# Patient Record
Sex: Female | Born: 1980 | Race: White | Hispanic: No | Marital: Single | Smoking: Former smoker
Health system: Southern US, Community
[De-identification: ages and names within clinical notes are randomized; demographics above are authoritative.]

## PROBLEM LIST (undated history)

## (undated) ENCOUNTER — Inpatient Hospital Stay (HOSPITAL_COMMUNITY): Payer: Self-pay

## (undated) DIAGNOSIS — F988 Other specified behavioral and emotional disorders with onset usually occurring in childhood and adolescence: Secondary | ICD-10-CM

## (undated) DIAGNOSIS — N390 Urinary tract infection, site not specified: Secondary | ICD-10-CM

## (undated) HISTORY — DX: Other specified behavioral and emotional disorders with onset usually occurring in childhood and adolescence: F98.8

## (undated) HISTORY — PX: TUBAL LIGATION: SHX77

---

## 2010-02-03 ENCOUNTER — Emergency Department (HOSPITAL_BASED_OUTPATIENT_CLINIC_OR_DEPARTMENT_OTHER): Admission: EM | Admit: 2010-02-03 | Discharge: 2010-02-03 | Payer: Self-pay | Admitting: Emergency Medicine

## 2010-05-27 ENCOUNTER — Emergency Department (HOSPITAL_COMMUNITY): Admission: EM | Admit: 2010-05-27 | Discharge: 2010-05-27 | Payer: Self-pay | Admitting: Family Medicine

## 2010-10-02 ENCOUNTER — Ambulatory Visit: Payer: Self-pay | Admitting: Family Medicine

## 2010-10-02 DIAGNOSIS — IMO0002 Reserved for concepts with insufficient information to code with codable children: Secondary | ICD-10-CM | POA: Insufficient documentation

## 2010-12-02 ENCOUNTER — Ambulatory Visit: Payer: Self-pay | Admitting: Family Medicine

## 2010-12-02 LAB — CONVERTED CEMR LAB
Blood in Urine, dipstick: NEGATIVE
Nitrite: NEGATIVE
Protein, U semiquant: NEGATIVE
Urobilinogen, UA: 0.2
WBC Urine, dipstick: NEGATIVE

## 2011-01-21 NOTE — Assessment & Plan Note (Signed)
Summary: NP/[employer],DF   Vital Signs:  Patient profile:   30 year old female Height:      63 inches Weight:      152.8 pounds BMI:     27.17 Pulse rate:   58 / minute BP sitting:   114 / 70  (left arm) Cuff size:   regular  Vitals Entered By: Garen Grams LPN (October 02, 2010 3:41 PM)  Vision Screen Left Eye w/o Correction: 20/:  20 Right Eye w/o Correction: 20/:  25 Both Eyes w/o Correction: 20/:  20 CC: New Patient, needs forms filled out Is Patient Diabetic? No Pain Assessment Patient in pain? no       Vision Screening:Left eye w/o correction: 20 / 20 Right Eye w/o correction: 20 / 25 Both eyes w/o correction:  20/ 20        Vision Entered By: Garen Grams LPN (October 02, 2010 3:44 PM)   CC:  New Patient and needs forms filled out.  History of Present Illness: pt presents for a work physical.  she denies any acute complaints.    Preventive Screening-Counseling & Management  Alcohol-Tobacco     Smoking Status: quit  Caffeine-Diet-Exercise     Does Patient Exercise: yes      Drug Use:  no.    Past History:  Past Medical History: Preeclampsia with PTD at 34 wks, 2004 IUGR with PTD at 32 wks via emergent C/S for NRFHTs, female, 2009 Term NSVD, female, 2006  Past Surgical History: Caesarean section  Family History: unremarkable  Social History: Married Former Smoker Alcohol use-no Drug use-no Regular exercise-yes Smoking Status:  quit Drug Use:  no Does Patient Exercise:  yes  Physical Exam  General:  Well-developed,well-nourished,in no acute distress; alert,appropriate and cooperative throughout examination Head:  Normocephalic and atraumatic without obvious abnormalities. No apparent alopecia or balding. Eyes:  No corneal or conjunctival inflammation noted. EOMI. Perrla. Funduscopic exam benign, without hemorrhages, exudates or papilledema. Vision grossly normal. Ears:  External ear exam shows no significant lesions or deformities.   Otoscopic examination reveals clear canals, tympanic membranes are intact bilaterally without bulging, retraction, inflammation or discharge. Hearing is grossly normal bilaterally. Nose:  External nasal examination shows no deformity or inflammation. Nasal mucosa are pink and moist without lesions or exudates. Mouth:  Oral mucosa and oropharynx without lesions or exudates.  Teeth in good repair. Neck:  No deformities, masses, or tenderness noted. Lungs:  Normal respiratory effort, chest expands symmetrically. Lungs are clear to auscultation, no crackles or wheezes. Heart:  Normal rate and regular rhythm. S1 and S2 normal without gallop, murmur, click, rub or other extra sounds. Abdomen:  Bowel sounds positive,abdomen soft and non-tender without masses, organomegaly or hernias noted. Msk:  No deformity or scoliosis noted of thoracic or lumbar spine.   Pulses:  R and L carotid,radial,femoral,dorsalis pedis and posterior tibial pulses are full and equal bilaterally Extremities:  No clubbing, cyanosis, edema, or deformity noted with normal full range of motion of all joints.   Neurologic:  No cranial nerve deficits noted. Station and gait are normal. Plantar reflexes are down-going bilaterally. DTRs are symmetrical throughout. Sensory, motor and coordinative functions appear intact.   Impression & Recommendations:  Problem # 1:  HEALTH MAINTENANCE EXAM (ICD-V70.0) Assessment Improved pt doing well.  pt to return to clinic for next well woman exam in April or sooner if needed.  Pt has appointment 10/13/10 for labs.    Patient Instructions: 1)  It was a pleasure to  be your provider today.   2)  F/U in April for next Pap smear and well woman exam.   3)  pt may return to clinic sooner if needed.

## 2011-01-23 NOTE — Assessment & Plan Note (Signed)
Summary: finish school form,df   Vital Signs:  Patient profile:   30 year old female Weight:      152 pounds Temp:     98.4 degrees F oral Pulse rate:   77 / minute Pulse rhythm:   regular BP sitting:   115 / 76  (left arm) Cuff size:   regular  Vitals Entered By: Loralee Pacas CMA (December 02, 2010 5:00 PM)   Other Orders: Urinalysis-[facility] (00000) Hearing- [facility] [phone])   Orders Added: 1)  Urinalysis-[facility] [00000] 2)  Hearing- [facility] [[id number]]    Laboratory Results   Urine Tests  Date/Time Received: December 02, 2010 5:08 PM  Date/Time Reported: December 02, 2010 5:20 PM   Routine Urinalysis   Color: yellow Appearance: Clear Glucose: negative   (Normal Range: Negative) Bilirubin: negative   (Normal Range: Negative) Ketone: negative   (Normal Range: Negative) Spec. Gravity: >=1.030   (Normal Range: 1.003-1.035) Blood: negative   (Normal Range: Negative) pH: 5.5   (Normal Range: 5.0-8.0) Protein: negative   (Normal Range: Negative) Urobilinogen: 0.2   (Normal Range: 0-1) Nitrite: negative   (Normal Range: Negative) Leukocyte Esterace: negative   (Normal Range: Negative)    Comments: .........Marland Kitchenbiochemical negative; microscopic not indicated ...............test performed by......Marland KitchenBonnie A. [place], MLS (ASCP)cm

## 2011-03-13 LAB — URINALYSIS, ROUTINE W REFLEX MICROSCOPIC
Glucose, UA: NEGATIVE mg/dL
Specific Gravity, Urine: 1.031 — ABNORMAL HIGH (ref 1.005–1.030)

## 2011-03-13 LAB — URINE MICROSCOPIC-ADD ON

## 2011-05-13 ENCOUNTER — Emergency Department (HOSPITAL_COMMUNITY)
Admission: EM | Admit: 2011-05-13 | Discharge: 2011-05-13 | Discharge status: Against Medical Advice | Payer: 59 | Attending: Emergency Medicine | Admitting: Emergency Medicine

## 2011-05-13 ENCOUNTER — Emergency Department (HOSPITAL_BASED_OUTPATIENT_CLINIC_OR_DEPARTMENT_OTHER)
Admission: EM | Admit: 2011-05-13 | Discharge: 2011-05-13 | Disposition: A | Discharge status: Home / Self Care | Payer: 59 | Attending: Emergency Medicine | Admitting: Emergency Medicine

## 2011-05-13 DIAGNOSIS — R319 Hematuria, unspecified: Secondary | ICD-10-CM | POA: Insufficient documentation

## 2011-05-13 DIAGNOSIS — R3989 Other symptoms and signs involving the genitourinary system: Secondary | ICD-10-CM | POA: Insufficient documentation

## 2011-05-13 DIAGNOSIS — R350 Frequency of micturition: Secondary | ICD-10-CM | POA: Insufficient documentation

## 2011-05-13 DIAGNOSIS — N390 Urinary tract infection, site not specified: Secondary | ICD-10-CM | POA: Insufficient documentation

## 2011-05-13 LAB — URINALYSIS, ROUTINE W REFLEX MICROSCOPIC
Bilirubin Urine: NEGATIVE
Glucose, UA: NEGATIVE mg/dL
Specific Gravity, Urine: 1.03 (ref 1.005–1.030)
Urobilinogen, UA: 1 mg/dL (ref 0.0–1.0)

## 2011-05-13 LAB — URINE MICROSCOPIC-ADD ON

## 2011-08-21 ENCOUNTER — Encounter: Payer: Self-pay | Admitting: Family Medicine

## 2011-08-21 ENCOUNTER — Ambulatory Visit (INDEPENDENT_AMBULATORY_CARE_PROVIDER_SITE_OTHER): Payer: 59 | Admitting: Family Medicine

## 2011-08-21 VITALS — BP 115/81 | HR 86 | Wt 155.7 lb

## 2011-08-21 DIAGNOSIS — F988 Other specified behavioral and emotional disorders with onset usually occurring in childhood and adolescence: Secondary | ICD-10-CM

## 2011-08-21 NOTE — Progress Notes (Signed)
Subjective:     History was provided by the patient. Shelby Campos is a 30 y.o. female here for evaluation of inattention and distractibility and school related problems.    She has been identified by school personnel as having problems with impulsivity, increased motor activity and classroom disruption.   HPI: Shelby Campos has a several year history of increased motor activity with additional behaviors that include inattention and need for frequent task redirection denies symptoms of impulsivity and is redirectable. 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 Pt was diagnosed with ADD in high school but has been off of meds for 10 years.  She states she did not feel any benefit from the medication and then stopped the meds.  Previously given adderrall for her symptoms.   A review of past neuropsychiatric issues was negative for anxiety disorder, known cognitive impairment, major depression, memory disorder, mood disorder, overt psychiatric disease and speech and language delay.   Similar problems have been observed in other family members.  Inattention criteria reported today include: fails to give close attention to details or makes careless mistakes in school, work, or other activities, has difficulty sustaining attention in tasks or play activities, has difficulty organizing tasks and activities and is easily distracted by extraneous stimuli.  Hyperactivity criteria reported today include: none.  Impulsivity criteria reported today include: blurts out answers before questions have been completed and interrupts or intrudes on others  The following portions of the patient's history were reviewed and updated as appropriate: allergies, current medications, past family history, past medical history, past social history, past surgical history and problem list.  Review of Systems Pertinent items are noted in HPI    Objective:    BP  115/81  Pulse 86  Wt 155 lb 11.2 oz (70.625 kg)  LMP 07/23/2011 Observation of Shelby Campos's behaviors in the exam room included no unusual behaviors.   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  However, without any medication for 10 years.    Plan:    1. Will refer the patient for formal diagnosis and testing for ADD/ADHD.  Will check for TSH.    Duration of today's visit was 15 minutes, with greater than 50% being counseling and care planning.  Follow-up in 2 weeks

## 2011-08-21 NOTE — Patient Instructions (Signed)
It was a pleasure to care for you today. Please make a follow up appointment in 2-3 weeks to discuss ADD and possible medication.  You will be referred for testing to confirm the diagnosis.  Please return after your testing is complete.  Attention Deficit-Hyperactivity Disorder ADHD Attention deficit-hyperactivity disorder (ADHD) is a problem with behavior issues based on the way the brain functions (neurobehavioral disorder). It is a common reason for behavior and academic problems in school. CAUSES The cause of ADHD is unknown in most cases. It may run in families. It sometimes can be associated with learning disabilities and other behavioral problems. SYMPTOMS There are three types of ADHD. Some of the symptoms include:  Inattentive   Gets bored or distracted easily   Loses or forgets things. Forgets to hand in homework.   Has trouble organizing or completing tasks.   Difficulty staying on task.   An inability to organize daily tasks and school work.   Leaving projects, chores and homework unfinished.   Trouble paying attention or responding to details. Careless mistakes.   Difficulty following directions. Often seems like is not listening.   Dislikes activities that require sustained attention (like chores or homework).   Hyperactive-impulsive   Feels like it is impossible to sit still or stay in a seat. Fidgeting with hands and feet.   Trouble waiting turn.   Talking too much or out of turn. Interruptive.   Speaks or acts impulsively   Aggressive, disruptive behavior   Constantly busy or on the go, noisy.   Combined   Has symptoms of both of the above.  Often children with ADHD feel discouraged about themselves and with school. They often perform well below their abilities in school. These symptoms can cause problems in home, school, and in relationships with peers. As children get older, the excess motor activities can calm down, but the problems with paying  attention and staying organized persist. Most children do not outgrow ADHD but with good treatment can learn to cope with the symptoms. DIAGNOSIS When ADHD is suspected, the diagnosis should be made by professionals trained in ADHD.  Diagnosis will include:  Ruling out other reasons for the child's behavior.   The caregivers will check with the child's school and check their medical records.   They will talk to teachers and parents.   Behavior rating scales for the child will be filled out by those dealing with the child on a daily basis.  A diagnosis is made only after all information has been considered. TREATMENT Treatment usually includes behavioral treatment often along with medicines. It may include stimulant medicines. The stimulant medicines decrease impulsivity and hyperactivity and increase attention. Other medicines used include antidepressants and certain blood pressure medicines. Most experts agree that treatment for ADHD should address all aspects of the child's functioning. Treatment should not be limited to the use of medicines alone. Treatment should include structured classroom management. The parents must receive education to address rewarding good behavior, discipline and limit-setting. Tutoring and/or behavioral therapy should be available for the child. If untreated, the disorder can have long term serious effects into adolescence and adulthood. HOMECARE INSTRUCTIONS   Often with ADHD there is a lot of frustration among the family in dealing with the illness. There is often blame and anger that is not warranted. This is a life long illness. There is no way to prevent ADHD. In many cases, because the problem affects the family as a whole, the entire family may need help.  A therapist can help the family find better ways to handle the disruptive behaviors and promote change. If the child is young, most of the therapist's work is with the parents. Parents will learn techniques for  coping with and improving their child's behavior. Sometimes only the child with the ADHD needs counseling. Your caregivers can help you make these decisions.   Children with ADHD may need help in organizing. Here are some helpful tips:   Keep routines the same every day from wake-up time to bedtime. Schedule everything. This includes homework and playtime. This should include outdoor and indoor recreation. Keep the schedule on the refrigerator or a bulletin board where it is frequently seen. Mark schedule changes as far in advance as possible.   Have a place for everything and keep everything in its place. This includes clothing, backpacks, and school supplies.   Encourage writing down assignments and bringing home needed books.   Offer your child a well-balanced diet. Breakfast is especially important for school performance. Children should avoid drinks with caffeine including:   Soft drinks.   Coffee.   Tea.   However, some older children (adolescents) may find these drinks helpful in improving their attention.   Children with ADHD need consistent rules that they can understand and follow. If rules are followed, give small rewards. Children with ADHD often receive, and expect, criticism. Look for good behavior and praise it. Set realistic goals. Give clear instructions. Look for activities that can foster success and self-esteem. Make time for pleasant activities with your child. Give lots of affection.   Parents are their children's greatest advocates. Learn as much as possible about ADHD. This helps you become a stronger and better advocate for your child. It also helps you educate your child's teachers and instructors if they feel inadequate in these areas. Parent support groups are often helpful. A national group with local chapters is called CHADD (Children and Adults with Attention Deficit/Hyperactivity Disorder).  PROGNOSIS  There is no cure for ADHD. Children with the disorder  seldom outgrow it. Many find adaptive ways to accommodate the ADHD as they mature. SEEK MEDICAL CARE IF YOUR CHILD HAS:  Repeated muscle twitches, cough or speech outbursts.   Sleep problems.   Marked loss of appetite.   Depression.   New or worsening behavioral problems.   Dizziness.   Racing heart.   Stomach pains.   Headaches.  Document Released: 11/28/2002 Document Re-Released: 09/16/2008 [facility] Patient Information 2011 [place], [place].

## 2013-03-29 ENCOUNTER — Emergency Department (HOSPITAL_BASED_OUTPATIENT_CLINIC_OR_DEPARTMENT_OTHER)
Admission: EM | Admit: 2013-03-29 | Discharge: 2013-03-29 | Disposition: A | Discharge status: Home / Self Care | Payer: Self-pay | Attending: Emergency Medicine | Admitting: Emergency Medicine

## 2013-03-29 ENCOUNTER — Encounter (HOSPITAL_BASED_OUTPATIENT_CLINIC_OR_DEPARTMENT_OTHER): Payer: Self-pay

## 2013-03-29 ENCOUNTER — Emergency Department (HOSPITAL_BASED_OUTPATIENT_CLINIC_OR_DEPARTMENT_OTHER): Payer: Self-pay

## 2013-03-29 DIAGNOSIS — Z87891 Personal history of nicotine dependence: Secondary | ICD-10-CM | POA: Insufficient documentation

## 2013-03-29 DIAGNOSIS — R109 Unspecified abdominal pain: Secondary | ICD-10-CM | POA: Insufficient documentation

## 2013-03-29 DIAGNOSIS — Z8659 Personal history of other mental and behavioral disorders: Secondary | ICD-10-CM | POA: Insufficient documentation

## 2013-03-29 DIAGNOSIS — O039 Complete or unspecified spontaneous abortion without complication: Secondary | ICD-10-CM | POA: Insufficient documentation

## 2013-03-29 MED ORDER — ACETAMINOPHEN 500 MG PO TABS
1000.0000 mg | ORAL_TABLET | Freq: Once | ORAL | Status: AC
  Administered 2013-03-29: 1000 mg via ORAL

## 2013-03-29 MED ORDER — ACETAMINOPHEN 500 MG PO TABS
ORAL_TABLET | ORAL | Status: AC
  Administered 2013-03-29: 1000 mg via ORAL
  Filled 2013-03-29: qty 2

## 2013-03-29 NOTE — ED Provider Notes (Signed)
History     CSN: [id number]  Arrival date & time 03/29/13  4098   First MD Initiated Contact with Patient 03/29/13 1937      Chief Complaint  Patient presents with  . Vaginal Bleeding    (Consider location/radiation/quality/duration/timing/severity/associated sxs/prior treatment) Patient is a 32 y.o. female presenting with vaginal bleeding. The history is provided by the patient. No language interpreter was used.  Vaginal Bleeding This is a new problem. The current episode started today. The problem occurs constantly. Associated symptoms include abdominal pain. Nothing aggravates the symptoms. She has tried nothing for the symptoms.  Pt is early pregnant.  Last period was Feb 15.   Pt had a positive home pregnancy test.   Pt is a [id number]  Pt complains of cramping.  Pt has not had any prenatal care   Pt is an Char[employer] fundraiser.   Past Medical History  Diagnosis Date  . 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 Cesarean section  2009    low transverse for NRFHTs    Family History  Problem Relation Age of Onset  . ADD / ADHD Mother   . Depression Mother   . ADD / ADHD Brother     History  Substance Use Topics  . Smoking status: Former Games developer  . Smokeless tobacco: Former Neurosurgeon    Quit date: 08/20/2008  . Alcohol Use: Yes    OB History   Grav Para Term Preterm Abortions TAB SAB Ect Mult Living                  Review of Systems  Gastrointestinal: Positive for abdominal pain.  Genitourinary: Positive for vaginal bleeding.  All other systems reviewed and are negative.    Allergies  Review of patient's allergies indicates no known allergies.  Home Medications  No current outpatient prescriptions on file.  BP 131/82  Pulse 77  Temp(Src) 99.8 F (37.7 C) (Oral)  Resp 20  Ht 5\' 2"  (1.575 m)  Wt 165 lb (74.844 kg)  BMI 30.17 kg/m2  SpO2 99%  LMP 02/02/2013  Physical Exam  Vitals  reviewed. Constitutional: She appears well-developed and well-nourished.  HENT:  Head: Normocephalic and atraumatic.  Eyes: Conjunctivae are normal. Pupils are equal, round, and reactive to light.  Neck: Normal range of motion. Neck supple.  Cardiovascular: Normal rate and regular rhythm.   Pulmonary/Chest: Effort normal and breath sounds normal.  Abdominal: Soft.  Genitourinary:  Moderate bleeding,  Os closed,    Musculoskeletal: Normal range of motion.  Neurological: She is alert.  Skin: Skin is warm.  Psychiatric: She has a normal mood and affect.    ED Course  Procedures (including critical care time)  Labs Reviewed  HCG, QUANTITATIVE, PREGNANCY - Abnormal; Notable for the following:    hCG, Beta Chain, Quant, [place] [address] (*)    All other components within normal limits   No results found.   No diagnosis found.    MDM   Results for orders placed during the hospital encounter of 03/29/13  HCG, QUANTITATIVE, PREGNANCY      Result Value Range   hCG, Beta Chain, Quant, [place] [address] (*) <5 mIU/mL   US Ob Comp Less 14 Wks  03/29/2013  *RADIOLOGY REPORT*  Clinical Data: Heavy vaginal bleeding and cramping. First trimester pregnancy.  The quantitative beta HCG is 45,323  OBSTETRIC <14 WK [place] AND TRANSVAGINAL OB US  Technique:  Both transabdominal and  transvaginal ultrasound examinations were performed for complete evaluation of the gestation as well as the maternal uterus, adnexal regions, and pelvic cul-de-sac.  Transvaginal technique was performed to assess early pregnancy.  Comparison:  None.  Intrauterine gestational sac:  None visualized  Maternal uterus/adnexae: Been made of canal is wide in the lower uterine segment hyperechoic material likely representing blood products.  The cervix is partially open. Minimal free fluid is present.  The  IMPRESSION:  1.  No intrauterine pregnancy. 2.  Hyperechoic material within the lower uterine segment is compared with blood products and the  clinical picture of abortion in progress.   Original Report Authenticated By: Marin Roberts, M.D.    US Ob Transvaginal  03/29/2013  *RADIOLOGY REPORT*  Clinical Data: Heavy vaginal bleeding and cramping. First trimester pregnancy.  The quantitative beta HCG is 45,323  OBSTETRIC <14 WK [place] AND TRANSVAGINAL OB US  Technique:  Both transabdominal and transvaginal ultrasound examinations were performed for complete evaluation of the gestation as well as the maternal uterus, adnexal regions, and pelvic cul-de-sac.  Transvaginal technique was performed to assess early pregnancy.  Comparison:  None.  Intrauterine gestational sac:  None visualized  Maternal uterus/adnexae: Been made of canal is wide in the lower uterine segment hyperechoic material likely representing blood products.  The cervix is partially open. Minimal free fluid is present.  The  IMPRESSION:  1.  No intrauterine pregnancy. 2.  Hyperechoic material within the lower uterine segment is compared with blood products and the clinical picture of abortion in progress.   Original Report Authenticated By: Marin Roberts, M.D.     Pt reports she passed a good size clot during ultrasound.   Pt has decreased bleeding and cramping now.   Pt is followed by [facility].   Pt reports she knows her blood type is positive.   Pt advised to recheck in 2 days.   Got to [place] [facility] or [facility] if heavier bleeding or increased pain.   Tylenol for crampin       Elson Areas, PA-C 03/29/13 2221

## 2013-03-29 NOTE — ED Notes (Signed)
PA  at bedside. Pelvic done

## 2013-03-29 NOTE — ED Notes (Signed)
Patient transported to Ultrasound 

## 2013-03-29 NOTE — ED Notes (Signed)
Vaginal bleeding since 6pm-2nd pad in place- LMP 2/12-positive home preg test last week

## 2013-03-30 NOTE — ED Provider Notes (Signed)
History/physical exam/procedure(s) were performed by non-physician practitioner and as supervising physician I was immediately available for consultation/collaboration. I have reviewed all notes and am in agreement with care and plan.   Hilario Quarry, MD 03/30/13 [phone]

## 2013-04-26 ENCOUNTER — Emergency Department (HOSPITAL_BASED_OUTPATIENT_CLINIC_OR_DEPARTMENT_OTHER)
Admission: EM | Admit: 2013-04-26 | Discharge: 2013-04-26 | Disposition: A | Discharge status: Home / Self Care | Payer: Self-pay | Attending: Emergency Medicine | Admitting: Emergency Medicine

## 2013-04-26 ENCOUNTER — Encounter (HOSPITAL_BASED_OUTPATIENT_CLINIC_OR_DEPARTMENT_OTHER): Payer: Self-pay | Admitting: *Deleted

## 2013-04-26 DIAGNOSIS — R110 Nausea: Secondary | ICD-10-CM | POA: Insufficient documentation

## 2013-04-26 DIAGNOSIS — Z87891 Personal history of nicotine dependence: Secondary | ICD-10-CM | POA: Insufficient documentation

## 2013-04-26 DIAGNOSIS — N390 Urinary tract infection, site not specified: Secondary | ICD-10-CM

## 2013-04-26 DIAGNOSIS — R350 Frequency of micturition: Secondary | ICD-10-CM | POA: Insufficient documentation

## 2013-04-26 DIAGNOSIS — Z8659 Personal history of other mental and behavioral disorders: Secondary | ICD-10-CM | POA: Insufficient documentation

## 2013-04-26 DIAGNOSIS — Z3202 Encounter for pregnancy test, result negative: Secondary | ICD-10-CM | POA: Insufficient documentation

## 2013-04-26 LAB — URINALYSIS, ROUTINE W REFLEX MICROSCOPIC
Glucose, UA: NEGATIVE mg/dL
Ketones, ur: 15 mg/dL — AB
Protein, ur: 100 mg/dL — AB
pH: 5 (ref 5.0–8.0)

## 2013-04-26 LAB — PREGNANCY, URINE: Preg Test, Ur: NEGATIVE

## 2013-04-26 LAB — URINE MICROSCOPIC-ADD ON

## 2013-04-26 MED ORDER — CEPHALEXIN 500 MG PO CAPS: 500.0000 mg | ORAL_CAPSULE | Freq: Four times a day (QID) | ORAL | Status: DC

## 2013-04-26 NOTE — ED Provider Notes (Signed)
Medical screening examination/treatment/procedure(s) were performed by non-physician practitioner and as supervising physician I was immediately available for consultation/collaboration.   Dione Booze, MD 04/26/13 1540

## 2013-04-26 NOTE — ED Notes (Signed)
States she has a UTI. Has been using OTC AZO. LMP now.

## 2013-04-26 NOTE — ED Provider Notes (Signed)
History     CSN: [id number]  Arrival date & time 04/26/13  1257   First MD Initiated Contact with Patient 04/26/13 1422      Chief Complaint  Patient presents with  . Urinary Tract Infection    (Consider location/radiation/quality/duration/timing/severity/associated sxs/prior treatment) Patient is a 32 y.o. female presenting with urinary tract infection. The history is provided by the patient. No language interpreter was used.  Urinary Tract Infection This is a new problem. The current episode started today. The problem occurs constantly. The problem has been gradually worsening. Associated symptoms include nausea and urinary symptoms. Nothing aggravates the symptoms. She has tried nothing for the symptoms. The treatment provided no relief.   Pt complains of burning with urination.  Pt feels like she has a uti Past Medical History  Diagnosis Date  . 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 Cesarean section  2009    low transverse for NRFHTs    Family History  Problem Relation Age of Onset  . ADD / ADHD Mother   . Depression Mother   . ADD / ADHD Brother     History  Substance Use Topics  . Smoking status: Former Games developer  . Smokeless tobacco: Former Neurosurgeon    Quit date: 08/20/2008  . Alcohol Use: Yes    OB History   Grav Para Term Preterm Abortions TAB SAB Ect Mult Living                  Review of Systems  Gastrointestinal: Positive for nausea.  Genitourinary: Positive for dysuria, urgency and frequency.  All other systems reviewed and are negative.    Allergies  Review of patient's allergies indicates no known allergies.  Home Medications  No current outpatient prescriptions on file.  BP 118/83  Pulse 64  Temp(Src) 98.2 F (36.8 C) (Oral)  Resp 20  Wt 165 lb (74.844 kg)  BMI 30.17 kg/m2  SpO2 98%  LMP 04/25/2013  Physical Exam  Nursing note and vitals  reviewed. Constitutional: She is oriented to person, place, and time. She appears well-developed and well-nourished.  HENT:  Head: Normocephalic.  Right Ear: External ear normal.  Left Ear: External ear normal.  Nose: Nose normal.  Mouth/Throat: Oropharynx is clear and moist.  Eyes: Conjunctivae and EOM are normal. Pupils are equal, round, and reactive to light.  Neck: Normal range of motion.  Cardiovascular: Normal rate.   Pulmonary/Chest: Effort normal and breath sounds normal.  Abdominal: Soft. There is no tenderness.  Musculoskeletal: Normal range of motion.  Neurological: She is alert and oriented to person, place, and time. She has normal reflexes.  Skin: Skin is warm.  Psychiatric: She has a normal mood and affect.    ED Course  Procedures (including critical care time)  Labs Reviewed  URINALYSIS, ROUTINE W REFLEX MICROSCOPIC - Abnormal; Notable for the following:    Color, Urine RED (*)    APPearance CLOUDY (*)    Hgb urine dipstick MODERATE (*)    Bilirubin Urine SMALL (*)    Ketones, ur 15 (*)    Protein, ur 100 (*)    Urobilinogen, UA 2.0 (*)    Nitrite POSITIVE (*)    Leukocytes, UA LARGE (*)    All other components within normal limits  URINE MICROSCOPIC-ADD ON - Abnormal; Notable for the following:    Bacteria, UA FEW (*)    All other components within normal limits  URINE CULTURE  PREGNANCY, URINE   No results found.   1. UTI (lower urinary tract infection)       MDM   Results for orders placed during the hospital encounter of 04/26/13  URINALYSIS, ROUTINE W REFLEX MICROSCOPIC      Result Value Range   Color, Urine RED (*) YELLOW   APPearance CLOUDY (*) CLEAR   Specific Gravity, Urine 1.014  1.005 - 1.030   pH 5.0  5.0 - 8.0   Glucose, UA NEGATIVE  NEGATIVE mg/dL   Hgb urine dipstick MODERATE (*) NEGATIVE   Bilirubin Urine SMALL (*) NEGATIVE   Ketones, ur 15 (*) NEGATIVE mg/dL   Protein, ur 782 (*) NEGATIVE mg/dL   Urobilinogen, UA 2.0 (*)  0.0 - 1.0 mg/dL   Nitrite POSITIVE (*) NEGATIVE   Leukocytes, UA LARGE (*) NEGATIVE  PREGNANCY, URINE      Result Value Range   Preg Test, Ur NEGATIVE  NEGATIVE  URINE MICROSCOPIC-ADD ON      Result Value Range   Squamous Epithelial / LPF RARE  RARE   WBC, UA TOO NUMEROUS TO COUNT  <3 WBC/hpf   RBC / HPF 21-50  <3 RBC/hpf   Bacteria, UA FEW (*) RARE   Urine-Other MUCOUS PRESENT     US Ob Comp Less 14 Wks  03/29/2013  *RADIOLOGY REPORT*  Clinical Data: Heavy vaginal bleeding and cramping. First trimester pregnancy.  The quantitative beta HCG is 45,323  OBSTETRIC <14 WK [place] AND TRANSVAGINAL OB US  Technique:  Both transabdominal and transvaginal ultrasound examinations were performed for complete evaluation of the gestation as well as the maternal uterus, adnexal regions, and pelvic cul-de-sac.  Transvaginal technique was performed to assess early pregnancy.  Comparison:  None.  Intrauterine gestational sac:  None visualized  Maternal uterus/adnexae: Been made of canal is wide in the lower uterine segment hyperechoic material likely representing blood products.  The cervix is partially open. Minimal free fluid is present.  The  IMPRESSION:  1.  No intrauterine pregnancy. 2.  Hyperechoic material within the lower uterine segment is compared with blood products and the clinical picture of abortion in progress.   Original Report Authenticated By: Marin Roberts, M.D.    US Ob Transvaginal  03/29/2013  *RADIOLOGY REPORT*  Clinical Data: Heavy vaginal bleeding and cramping. First trimester pregnancy.  The quantitative beta HCG is 45,323  OBSTETRIC <14 WK [place] AND TRANSVAGINAL OB US  Technique:  Both transabdominal and transvaginal ultrasound examinations were performed for complete evaluation of the gestation as well as the maternal uterus, adnexal regions, and pelvic cul-de-sac.  Transvaginal technique was performed to assess early pregnancy.  Comparison:  None.  Intrauterine gestational sac:  None  visualized  Maternal uterus/adnexae: Been made of canal is wide in the lower uterine segment hyperechoic material likely representing blood products.  The cervix is partially open. Minimal free fluid is present.  The  IMPRESSION:  1.  No intrauterine pregnancy. 2.  Hyperechoic material within the lower uterine segment is compared with blood products and the clinical picture of abortion in progress.   Original Report Authenticated By: Marin Roberts, M.D.      Pt given rx for keflex 500mg  one po qid.         Lonia Skinner [place], PA-C 04/26/13 1440  Lonia Skinner [place], [place] 04/26/13 1441

## 2013-04-28 LAB — URINE CULTURE

## 2013-04-29 ENCOUNTER — Telehealth (HOSPITAL_COMMUNITY): Payer: Self-pay | Admitting: Emergency Medicine

## 2013-04-29 NOTE — ED Notes (Signed)
Post ED Visit - Positive Culture Follow-up  Culture report reviewed by antimicrobial stewardship pharmacist: []  Wes Dulaney, Pharm.D., BCPS [x]  Celedonio Miyamoto, Pharm.D., BCPS []  Georgina Pillion, Pharm.D., BCPS []  [place], [place].D., BCPS, AAHIVP []  Estella Husk, Pharm.D., BCPS, AAHIV  Positive urine culture Treated with keflex, organism sensitive to the same and no further patient follow-up is required at this time.  Kylie A Holland 04/29/2013, 2:15 PM

## 2013-07-09 ENCOUNTER — Emergency Department (HOSPITAL_BASED_OUTPATIENT_CLINIC_OR_DEPARTMENT_OTHER)
Admission: EM | Admit: 2013-07-09 | Discharge: 2013-07-09 | Disposition: A | Discharge status: Home / Self Care | Payer: Self-pay | Attending: Emergency Medicine | Admitting: Emergency Medicine

## 2013-07-09 ENCOUNTER — Encounter (HOSPITAL_BASED_OUTPATIENT_CLINIC_OR_DEPARTMENT_OTHER): Payer: Self-pay | Admitting: Emergency Medicine

## 2013-07-09 DIAGNOSIS — Z8659 Personal history of other mental and behavioral disorders: Secondary | ICD-10-CM | POA: Insufficient documentation

## 2013-07-09 DIAGNOSIS — Z87891 Personal history of nicotine dependence: Secondary | ICD-10-CM | POA: Insufficient documentation

## 2013-07-09 DIAGNOSIS — IMO0001 Reserved for inherently not codable concepts without codable children: Secondary | ICD-10-CM | POA: Insufficient documentation

## 2013-07-09 DIAGNOSIS — Y929 Unspecified place or not applicable: Secondary | ICD-10-CM | POA: Insufficient documentation

## 2013-07-09 DIAGNOSIS — S90569A Insect bite (nonvenomous), unspecified ankle, initial encounter: Secondary | ICD-10-CM | POA: Insufficient documentation

## 2013-07-09 DIAGNOSIS — Y939 Activity, unspecified: Secondary | ICD-10-CM | POA: Insufficient documentation

## 2013-07-09 DIAGNOSIS — Z79899 Other long term (current) drug therapy: Secondary | ICD-10-CM | POA: Insufficient documentation

## 2013-07-09 DIAGNOSIS — W57XXXA Bitten or stung by nonvenomous insect and other nonvenomous arthropods, initial encounter: Secondary | ICD-10-CM

## 2013-07-09 MED ORDER — PREDNISONE 20 MG PO TABS: ORAL_TABLET | ORAL | Status: DC

## 2013-07-09 NOTE — ED Notes (Signed)
Pt reports rash to bilateral arms, saw np and was given steroid cream ( kenalog cream) 1 week ago with no improvement

## 2013-07-09 NOTE — ED Provider Notes (Signed)
   History    CSN: [id number] Arrival date & time 07/09/13  0120  First MD Initiated Contact with Patient 07/09/13 0128     Chief Complaint  Patient presents with  . Rash   (Consider location/radiation/quality/duration/timing/severity/associated sxs/prior Treatment) Patient is a 32 y.o. female presenting with rash. The history is provided by the patient.  Rash Pain location: arms and knees  Pain quality comment:  Itching Pain radiates to:  Does not radiate Pain severity:  No pain Timing:  Constant Progression:  Unchanged Chronicity:  New Context comment:  Dogs Relieved by:  Nothing Worsened by:  Nothing tried Ineffective treatments:  None tried Associated symptoms: no fever   Risk factors: no recent hospitalization    Past Medical History  Diagnosis Date  . 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 Cesarean section  2009    low transverse for NRFHTs   Family History  Problem Relation Age of Onset  . ADD / ADHD Mother   . Depression Mother   . ADD / ADHD Brother    History  Substance Use Topics  . Smoking status: Former Games developer  . Smokeless tobacco: Former Neurosurgeon    Quit date: 08/20/2008  . Alcohol Use: Yes   OB History   Grav Para Term Preterm Abortions TAB SAB Ect Mult Living                 Review of Systems  Constitutional: Negative for fever.  Skin: Positive for rash.  All other systems reviewed and are negative.    Allergies  Review of patient's allergies indicates no known allergies.  Home Medications   Current Outpatient Rx  Name  Route  Sig  Dispense  Refill  . triamcinolone (KENALOG) 0.025 % cream   Topical   Apply topically 2 (two) times daily.         . cephALEXin (KEFLEX) 500 MG capsule   Oral   Take 1 capsule (500 mg total) by mouth 4 (four) times daily.   40 capsule   0   . predniSONE (DELTASONE) 20 MG tablet      2 tabs po daily x 4 days   8  tablet   0    BP 106/68  Temp(Src) 98.3 F (36.8 C) (Oral)  Resp 18  SpO2 99%  LMP 06/26/2013 Physical Exam  Constitutional: She is oriented to person, place, and time. She appears well-developed and well-nourished. No distress.  HENT:  Head: Normocephalic and atraumatic.  Mouth/Throat: Oropharynx is clear and moist.  Eyes: Conjunctivae are normal. Pupils are equal, round, and reactive to light.  Neck: Normal range of motion. Neck supple.  Cardiovascular: Normal rate, regular rhythm and intact distal pulses.   Pulmonary/Chest: Effort normal and breath sounds normal.  Abdominal: Soft. Bowel sounds are normal.  Musculoskeletal: Normal range of motion.  Neurological: She is alert and oriented to person, place, and time.  Skin: Skin is warm and dry.  Scabbed lesions on the forearms and knee   Psychiatric: She has a normal mood and affect.    ED Course  Procedures (including critical care time) Labs Reviewed - No data to display No results found. 1. Insect bites     MDM  Lesions consistent with flea bites.  Can't take benadryl with give steroids for itching.   Jasmine Awe, MD 07/09/13 [phone]

## 2013-08-06 ENCOUNTER — Encounter (HOSPITAL_BASED_OUTPATIENT_CLINIC_OR_DEPARTMENT_OTHER): Payer: Self-pay

## 2013-08-06 ENCOUNTER — Emergency Department (HOSPITAL_BASED_OUTPATIENT_CLINIC_OR_DEPARTMENT_OTHER)
Admission: EM | Admit: 2013-08-06 | Discharge: 2013-08-06 | Disposition: A | Discharge status: Home / Self Care | Payer: Self-pay | Attending: Emergency Medicine | Admitting: Emergency Medicine

## 2013-08-06 DIAGNOSIS — Z87891 Personal history of nicotine dependence: Secondary | ICD-10-CM | POA: Insufficient documentation

## 2013-08-06 DIAGNOSIS — Z8659 Personal history of other mental and behavioral disorders: Secondary | ICD-10-CM | POA: Insufficient documentation

## 2013-08-06 DIAGNOSIS — Z3202 Encounter for pregnancy test, result negative: Secondary | ICD-10-CM | POA: Insufficient documentation

## 2013-08-06 DIAGNOSIS — R350 Frequency of micturition: Secondary | ICD-10-CM | POA: Insufficient documentation

## 2013-08-06 DIAGNOSIS — N390 Urinary tract infection, site not specified: Secondary | ICD-10-CM | POA: Insufficient documentation

## 2013-08-06 HISTORY — DX: Urinary tract infection, site not specified: N39.0

## 2013-08-06 LAB — URINALYSIS, ROUTINE W REFLEX MICROSCOPIC
Glucose, UA: NEGATIVE mg/dL
Ketones, ur: NEGATIVE mg/dL
Nitrite: NEGATIVE
Protein, ur: NEGATIVE mg/dL

## 2013-08-06 LAB — URINE MICROSCOPIC-ADD ON

## 2013-08-06 LAB — PREGNANCY, URINE: Preg Test, Ur: NEGATIVE

## 2013-08-06 MED ORDER — CIPROFLOXACIN HCL 500 MG PO TABS
500.0000 mg | ORAL_TABLET | Freq: Once | ORAL | Status: AC
  Administered 2013-08-06: 500 mg via ORAL
  Filled 2013-08-06: qty 1

## 2013-08-06 MED ORDER — CIPROFLOXACIN HCL 500 MG PO TABS: 500.0000 mg | ORAL_TABLET | Freq: Two times a day (BID) | ORAL | Status: DC

## 2013-08-06 NOTE — ED Notes (Signed)
Pt reports urinary frequency that developed last night.

## 2013-08-06 NOTE — ED Provider Notes (Signed)
CSN: [id number]     Arrival date & time 08/06/13  1025 History     First MD Initiated Contact with Patient 08/06/13 1032     Chief Complaint  Patient presents with  . Urinary Tract Infection   (Consider location/radiation/quality/duration/timing/severity/associated sxs/prior Treatment) The history is provided by the patient.  Shelby Campos is a 32 y.o. female hx of UTI, here with urinary frequency. Had intercourse with her female partner yesterday and then had some urinary frequency. This morning she was increased frequency and also felt like her bladder is not emptying. Denies any dysuria or flank pain or fever or vomiting. Denies any vaginal bleeding or discharge. She is sexually active with one partner and no history of STDs (her or her partner). Had multiple UTIs previously.    Past Medical History  Diagnosis Date  . 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 UTI (urinary tract infection)    Past Surgical History  Procedure Laterality Date  . Cesarean section  2009    low transverse for NRFHTs   Family History  Problem Relation Age of Onset  . ADD / ADHD Mother   . Depression Mother   . ADD / ADHD Brother    History  Substance Use Topics  . Smoking status: Former Games developer  . Smokeless tobacco: Former Neurosurgeon    Quit date: 08/20/2008  . Alcohol Use: Yes     Comment: monthly   OB History   Grav Para Term Preterm Abortions TAB SAB Ect Mult Living                 Review of Systems  Genitourinary: Positive for frequency.  All other systems reviewed and are negative.    Allergies  Review of patient's allergies indicates no known allergies.  Home Medications  No current outpatient prescriptions on file. BP 119/78  Pulse 77  Temp(Src) 98.2 F (36.8 C) (Oral)  Resp 16  Ht 5\' 2"  (1.575 m)  Wt 160 lb (72.576 kg)  BMI 29.26 kg/m2  SpO2 100%  LMP 07/27/2013 Physical Exam  Nursing note and vitals reviewed. Constitutional: She is oriented  to person, place, and time. She appears well-developed and well-nourished.  NAD   HENT:  Head: Normocephalic.  Mouth/Throat: Oropharynx is clear and moist.  Eyes: Conjunctivae are normal. Pupils are equal, round, and reactive to light.  Neck: Normal range of motion.  Cardiovascular: Normal rate, regular rhythm and normal heart sounds.   Pulmonary/Chest: Effort normal and breath sounds normal. No respiratory distress. She has no wheezes. She has no rales.  Abdominal: Soft. Bowel sounds are normal. She exhibits no distension. There is no tenderness. There is no rebound and no guarding.  No CVAT and no suprapubic tenderness   Musculoskeletal: Normal range of motion.  Neurological: She is alert and oriented to person, place, and time.  Skin: Skin is warm and dry.  Psychiatric: She has a normal mood and affect. Her behavior is normal. Judgment and thought content normal.    ED Course   Procedures (including critical care time)  Labs Reviewed  URINALYSIS, ROUTINE W REFLEX MICROSCOPIC - Abnormal; Notable for the following:    APPearance CLOUDY (*)    Hgb urine dipstick TRACE (*)    Leukocytes, UA MODERATE (*)    All other components within normal limits  URINE MICROSCOPIC-ADD ON - Abnormal; Notable for the following:    Bacteria, UA MANY (*)    All other components within normal limits  URINE CULTURE  PREGNANCY, URINE   No results found. No diagnosis found.  MDM  Shelby Campos is a 32 y.o. female here with urinary frequency. Likely UTI. Will repeat UA and pregnancy test. Previous urine culture pan sensitive.   11:04 AM UCG neg. UA + leuk and many bacteria. Given cipro in ED and will d/c home with same.   Richardean Canal, MD 08/06/13 [phone]

## 2013-08-09 LAB — URINE CULTURE

## 2013-08-11 NOTE — ED Notes (Signed)
+   Urine Culture- treated with appropriate medication per protocol MD. 

## 2013-08-18 IMAGING — US US OB COMP LESS 14 WK
1 series · 14 of 28 positions shown · non-contrast
Comparison: None.

CLINICAL DATA: Heavy vaginal bleeding and cramping. First trimester
pregnancy.  The quantitative beta HCG is 45,323

OBSTETRIC <14 WK US AND TRANSVAGINAL OB US
TECHNIQUE: Both transabdominal and transvaginal ultrasound
examinations were performed for complete evaluation of the
gestation as well as the maternal uterus, adnexal regions, and
pelvic cul-de-sac.  Transvaginal technique was performed to assess
early pregnancy.

[Series 1: us ob comp less 14 wk · 0.24mm/px · 14 of 83 slices shown]
[im 4/83]
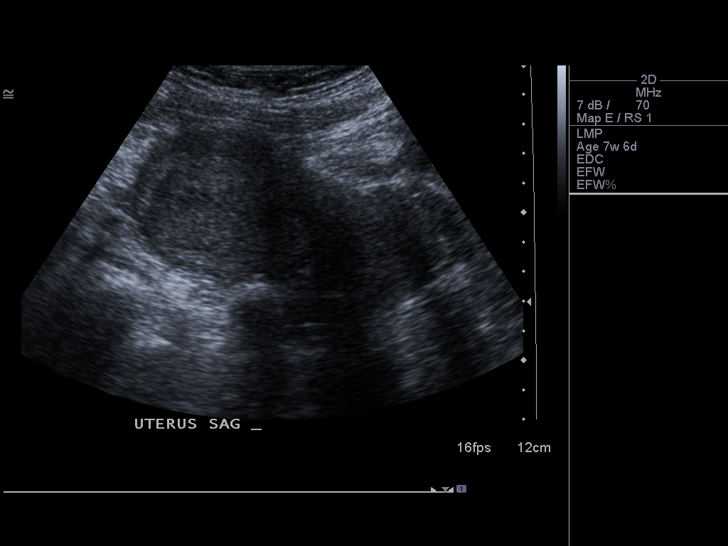
[im 10/83]
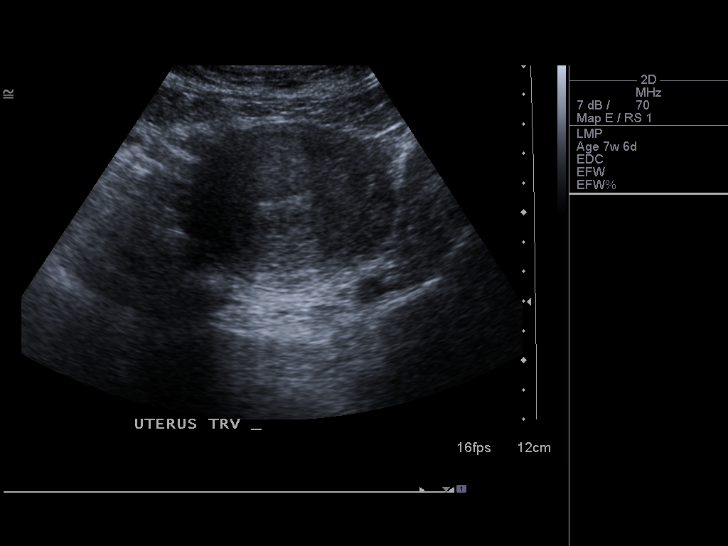
[im 16/83]
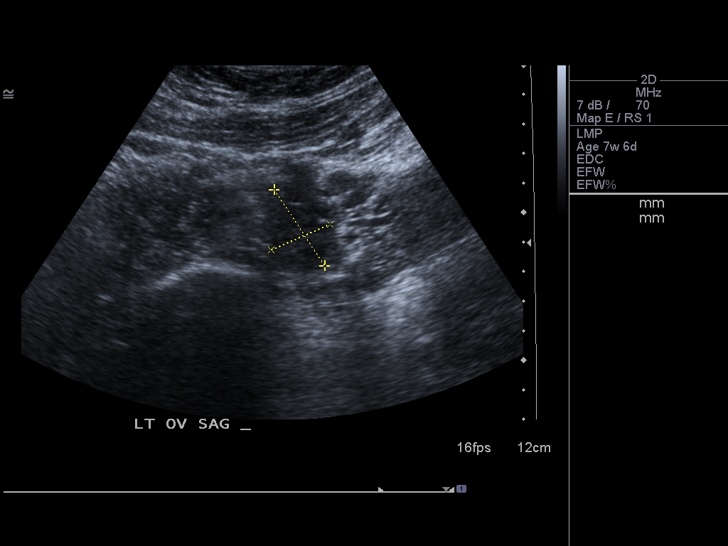
[im 22/83]
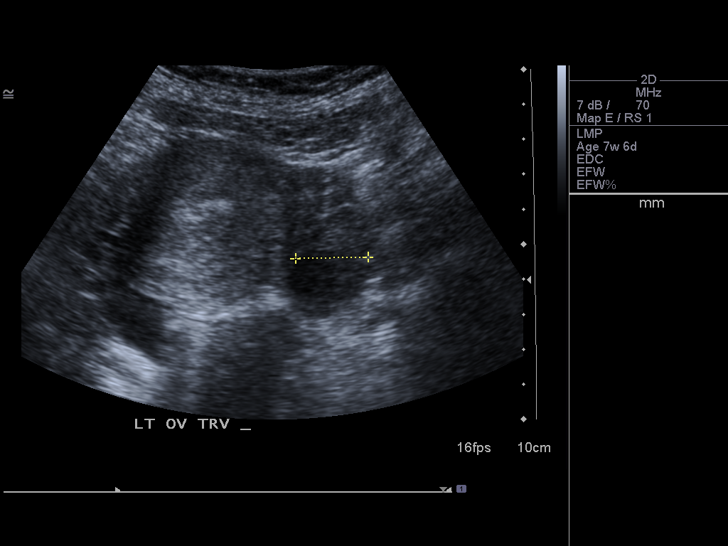
[im 28/83]
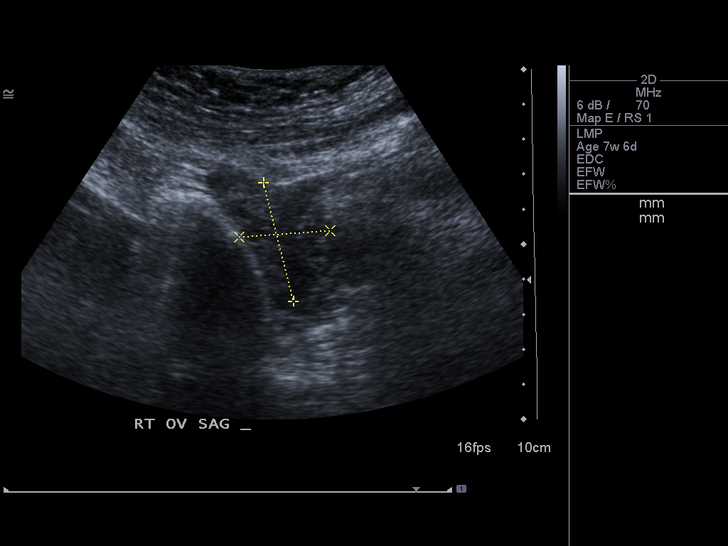
[im 34/83]
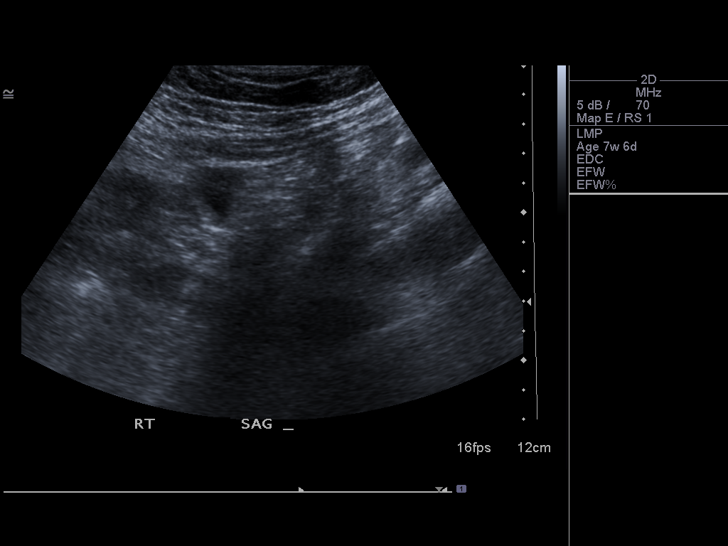
[im 40/83]
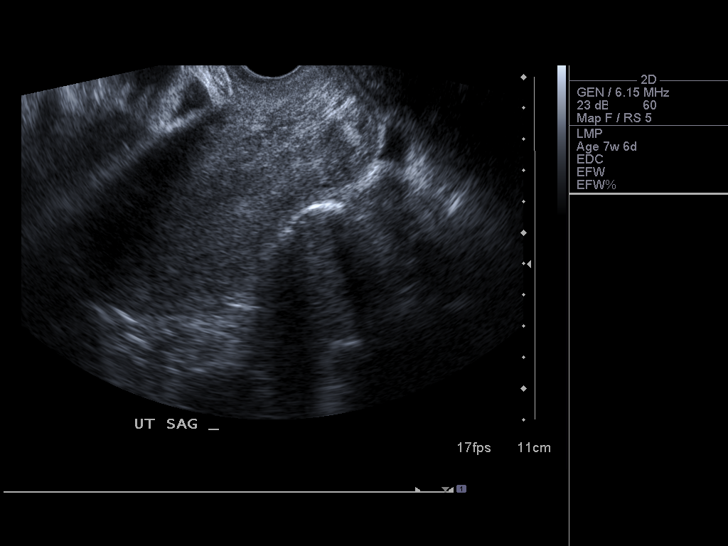
[im 46/83]
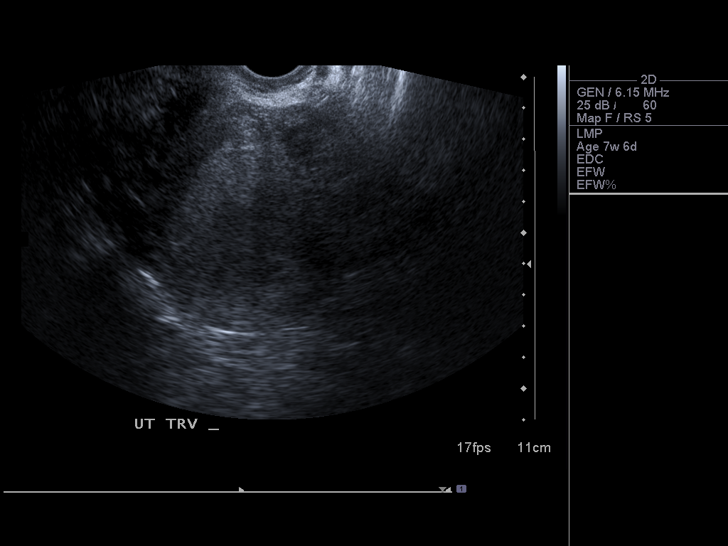
[im 52/83]
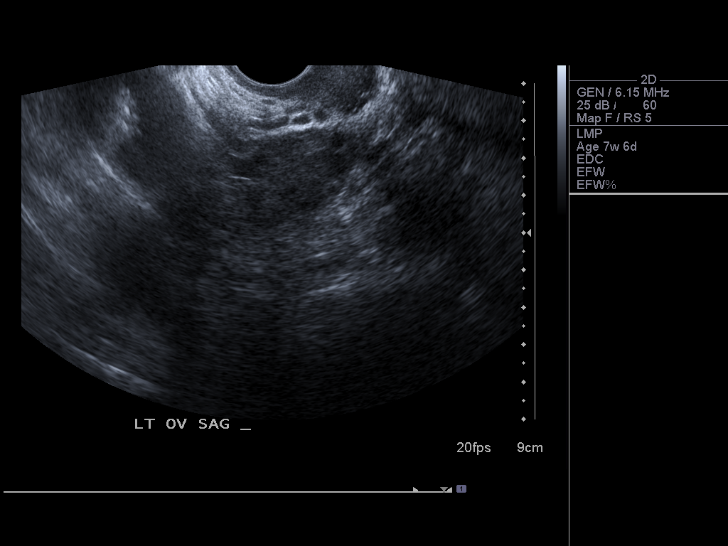
[im 58/83]
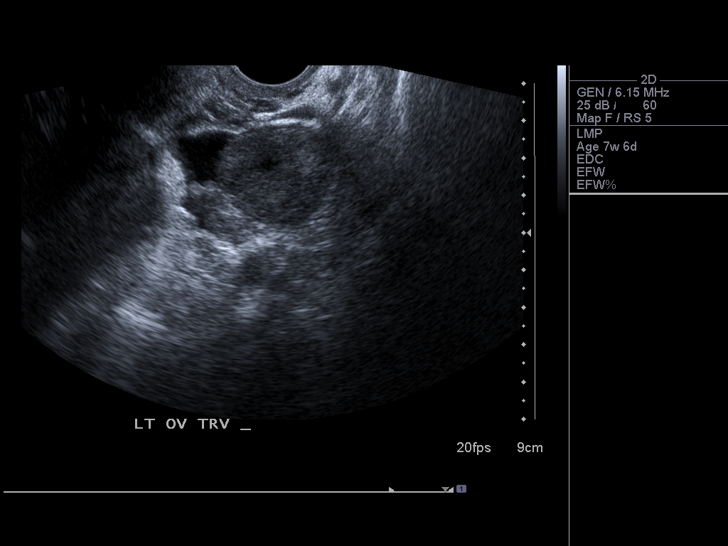
[im 64/83]
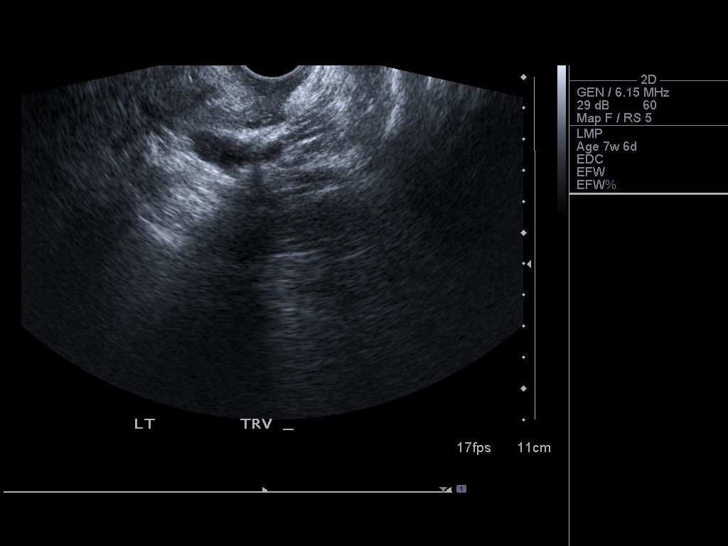
[im 70/83]
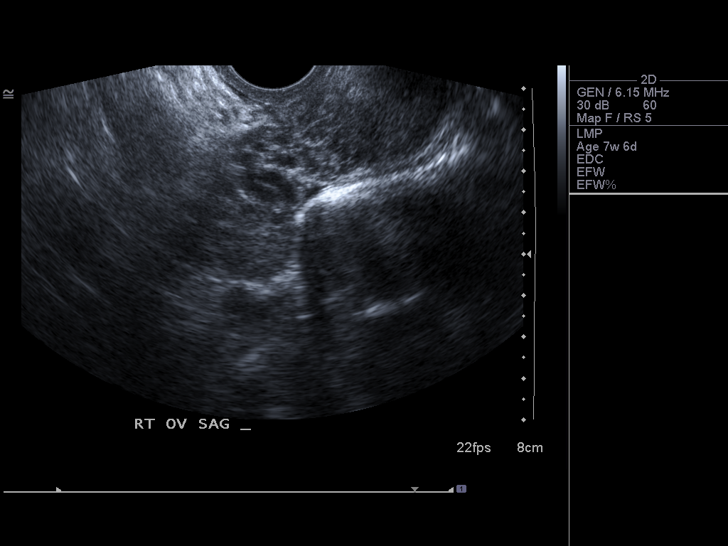
[im 76/83]
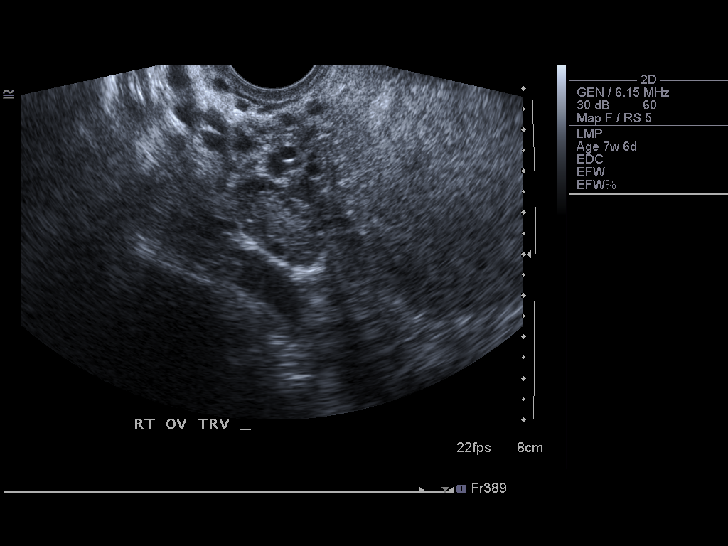
[im 83/83]
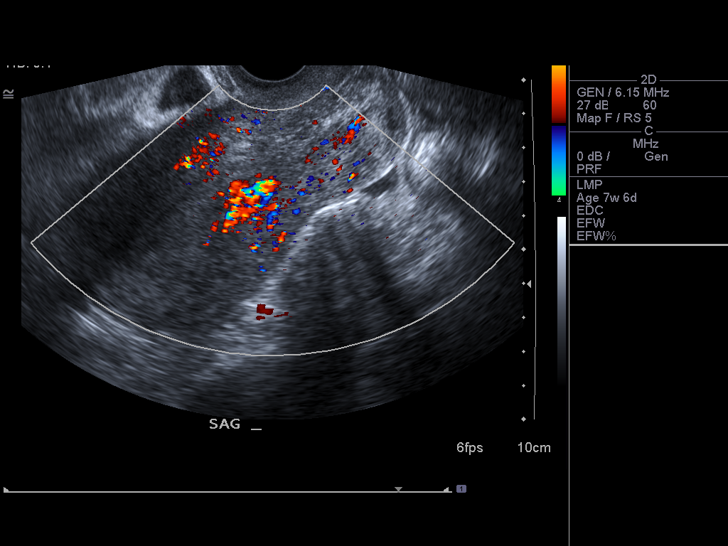

[14 of 28 positions shown; findings below may reference images not displayed]

Intrauterine gestational sac:  None visualized

Maternal uterus/adnexae:
Been made of canal is wide in the lower uterine segment hyperechoic
material likely representing blood products.  The cervix is
partially open. Minimal free fluid is present.  The
IMPRESSION: 1.  No intrauterine pregnancy.
2.  Hyperechoic material within the lower uterine segment is
compared with blood products and the clinical picture of abortion
in progress.

## 2014-02-23 ENCOUNTER — Encounter (HOSPITAL_BASED_OUTPATIENT_CLINIC_OR_DEPARTMENT_OTHER): Payer: Self-pay | Admitting: Emergency Medicine

## 2014-02-23 ENCOUNTER — Inpatient Hospital Stay (HOSPITAL_BASED_OUTPATIENT_CLINIC_OR_DEPARTMENT_OTHER)
Admission: EM | Admit: 2014-02-23 | Discharge: 2014-02-24 | Disposition: A | Discharge status: Home / Self Care | Payer: Commercial Managed Care - PPO | Attending: Obstetrics & Gynecology | Admitting: Obstetrics & Gynecology

## 2014-02-23 DIAGNOSIS — Z87891 Personal history of nicotine dependence: Secondary | ICD-10-CM | POA: Insufficient documentation

## 2014-02-23 DIAGNOSIS — O036 Delayed or excessive hemorrhage following complete or unspecified spontaneous abortion: Secondary | ICD-10-CM

## 2014-02-23 DIAGNOSIS — R109 Unspecified abdominal pain: Secondary | ICD-10-CM | POA: Insufficient documentation

## 2014-02-23 DIAGNOSIS — O4690 Antepartum hemorrhage, unspecified, unspecified trimester: Secondary | ICD-10-CM | POA: Insufficient documentation

## 2014-02-23 LAB — CBC WITH DIFFERENTIAL/PLATELET
BASOS ABS: 0 10*3/uL (ref 0.0–0.1)
BASOS PCT: 0 % (ref 0–1)
EOS ABS: 0.2 10*3/uL (ref 0.0–0.7)
Eosinophils Relative: 2 % (ref 0–5)
HEMATOCRIT: 34.6 % — AB (ref 36.0–46.0)
Hemoglobin: 11.9 g/dL — ABNORMAL LOW (ref 12.0–15.0)
Lymphocytes Relative: 20 % (ref 12–46)
Lymphs Abs: 1.7 10*3/uL (ref 0.7–4.0)
MCH: 30.5 pg (ref 26.0–34.0)
MCHC: 34.4 g/dL (ref 30.0–36.0)
MCV: 88.7 fL (ref 78.0–100.0)
MONO ABS: 0.6 10*3/uL (ref 0.1–1.0)
Monocytes Relative: 7 % (ref 3–12)
NEUTROS ABS: 5.9 10*3/uL (ref 1.7–7.7)
NEUTROS PCT: 70 % (ref 43–77)
Platelets: 242 10*3/uL (ref 150–400)
RBC: 3.9 MIL/uL (ref 3.87–5.11)
RDW: 12.1 % (ref 11.5–15.5)
WBC: 8.3 10*3/uL (ref 4.0–10.5)

## 2014-02-23 LAB — URINALYSIS, ROUTINE W REFLEX MICROSCOPIC
Bilirubin Urine: NEGATIVE
GLUCOSE, UA: NEGATIVE mg/dL
Ketones, ur: NEGATIVE mg/dL
LEUKOCYTES UA: NEGATIVE
Nitrite: NEGATIVE
PH: 6 (ref 5.0–8.0)
Protein, ur: NEGATIVE mg/dL
Specific Gravity, Urine: 1.029 (ref 1.005–1.030)
Urobilinogen, UA: 1 mg/dL (ref 0.0–1.0)

## 2014-02-23 LAB — COMPREHENSIVE METABOLIC PANEL
ALBUMIN: 3.4 g/dL — AB (ref 3.5–5.2)
ALT: 11 U/L (ref 0–35)
AST: 13 U/L (ref 0–37)
Alkaline Phosphatase: 55 U/L (ref 39–117)
BUN: 9 mg/dL (ref 6–23)
CHLORIDE: 105 meq/L (ref 96–112)
CO2: 25 mEq/L (ref 19–32)
CREATININE: 0.6 mg/dL (ref 0.50–1.10)
Calcium: 9 mg/dL (ref 8.4–10.5)
GFR calc Af Amer: >90 mL/min (ref >90)
GFR calc non Af Amer: >90 mL/min (ref >90)
Glucose, Bld: 101 mg/dL — ABNORMAL HIGH (ref 70–99)
Potassium: 3.5 mEq/L — ABNORMAL LOW (ref 3.7–5.3)
Sodium: 141 mEq/L (ref 137–147)
TOTAL PROTEIN: 7.2 g/dL (ref 6.0–8.3)

## 2014-02-23 LAB — URINE MICROSCOPIC-ADD ON

## 2014-02-23 LAB — PREGNANCY, URINE: PREG TEST UR: POSITIVE — AB

## 2014-02-23 LAB — ABO/RH: ABO/RH(D): AB POS

## 2014-02-23 LAB — HCG, QUANTITATIVE, PREGNANCY: hCG, Beta Chain, Quant, S: 1679 m[IU]/mL — ABNORMAL HIGH (ref <5)

## 2014-02-23 MED ORDER — SODIUM CHLORIDE 0.9 % IV BOLUS (SEPSIS)
1000.0000 mL | Freq: Once | INTRAVENOUS | Status: AC
  Administered 2014-02-23: 1000 mL via INTRAVENOUS

## 2014-02-23 MED ORDER — HYDROMORPHONE HCL PF 1 MG/ML IJ SOLN
1.0000 mg | Freq: Once | INTRAMUSCULAR | Status: AC
  Administered 2014-02-23: 1 mg via INTRAVENOUS
  Filled 2014-02-23: qty 1

## 2014-02-23 MED ORDER — METHYLERGONOVINE MALEATE 0.2 MG/ML IJ SOLN
0.2000 mg | Freq: Once | INTRAMUSCULAR | Status: AC
  Administered 2014-02-23: 0.2 mg via INTRAMUSCULAR
  Filled 2014-02-23: qty 1

## 2014-02-23 MED ORDER — METOCLOPRAMIDE HCL 5 MG/ML IJ SOLN
10.0000 mg | Freq: Once | INTRAMUSCULAR | Status: AC
  Administered 2014-02-23: 10 mg via INTRAVENOUS
  Filled 2014-02-23: qty 2

## 2014-02-23 NOTE — ED Notes (Signed)
Pt with a gush of clear fluid while seated on stretcher after labs drawn[facility] me [facility] to observe commode-tissue noted-H Damian LeavellNeese, EDNP observed-advised to removed from toilet with ring forceps and place in [facility] cup-done

## 2014-02-23 NOTE — ED Provider Notes (Signed)
CSN: [id number]     Arrival date & time 02/23/14  2053 History   First MD Initiated Contact with Patient 02/23/14 2118     Chief Complaint  Patient presents with  . Abdominal Cramping  . Possible Pregnancy     (Consider location/radiation/quality/duration/timing/severity/associated sxs/prior Treatment) Patient is a 33 y.o. female presenting with cramps and pregnancy problem. The history is provided by the patient.  Abdominal Cramping This is a new problem. The current episode started today. The problem occurs constantly. The problem has been gradually worsening. Associated symptoms include abdominal pain. Pertinent negatives include no chest pain, chills, fever, headaches, myalgias, nausea or vomiting.  Possible Pregnancy  Primary symptoms include abdominal pain, vaginal bleeding and vaginal discharge.  The vaginal discharge is not associated with dysuria.   Associated symptoms include no dysuria, no fever, no headaches, no light-headedness, no nausea, no shortness of breath and no vomiting.  Barrett ShellLindsay Keefe is a 33 y.o.  G5 P2, 1, 2 3 who presents to the ED with lower abdominal cramping that radiated to her back that started approximately 5 pm. She came to the ED and started bleeding and passed something in the toilet. After that the bleeding increased with clots. She is approximately [redacted] weeks pregnant by LMP. She has a history of SAB x2 @ [redacted] weeks gestation. She has not started prenatal care. Last pregnancy was 5 years ago and preterm delivery by C/S.   Past Medical History  Diagnosis Date  . 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 UTI (urinary tract infection)    Past Surgical History  Procedure Laterality Date  . Cesarean section  2009    low transverse for NRFHTs   Family History  Problem Relation Age of Onset  . ADD / ADHD Mother   . Depression Mother   . ADD / ADHD Brother    History  Substance Use Topics  . Smoking status: Former Games developermoker   . Smokeless tobacco: Former NeurosurgeonUser    Quit date: 08/20/2008  . Alcohol Use: Yes     Comment: monthly   OB History   Grav Para Term Preterm Abortions TAB SAB Ect Mult Living   1              Review of Systems  Constitutional: Negative for fever and chills.  HENT: Negative.   Eyes: Negative for visual disturbance.  Respiratory: Negative for shortness of breath and wheezing.   Cardiovascular: Negative for chest pain.  Gastrointestinal: Positive for abdominal pain. Negative for nausea, vomiting, diarrhea and constipation.  Genitourinary: Positive for vaginal bleeding and vaginal discharge. Negative for dysuria, urgency and frequency.  Musculoskeletal: Positive for back pain. Negative for myalgias.  Neurological: Negative for light-headedness and headaches.      Allergies  Review of patient's allergies indicates no known allergies.  Home Medications   Current Outpatient Rx  Name  Route  Sig  Dispense  Refill  . ciprofloxacin (CIPRO) 500 MG tablet   Oral   Take 1 tablet (500 mg total) by mouth 2 (two) times daily.   10 tablet   0    BP 129/73  Pulse 87  Temp(Src) 98.1 F (36.7 C) (Oral)  Resp 18  SpO2 100%  LMP 12/18/2013 Physical Exam  Nursing note and vitals reviewed. Constitutional: She is oriented to person, place, and time. She appears well-developed and well-nourished. No distress.  HENT:  Head: Normocephalic and atraumatic.  Eyes: EOM are normal.  Neck: Neck supple.  Cardiovascular: Normal  rate.   Pulmonary/Chest: Effort normal.  Abdominal: Soft. There is tenderness in the right lower quadrant, suprapubic area and left lower quadrant.  Genitourinary:  External genitalia without lesions. Large clots vaginal vault. Cervix finger tip, bilateral adnexal tenderness, uterus enlarged approximately 10 week size.   Musculoskeletal: Normal range of motion.  Neurological: She is alert and oriented to person, place, and time. No cranial nerve deficit.  Skin: Skin is  warm and dry.  Psychiatric: She has a normal mood and affect. Her behavior is normal.   Results for orders placed during the hospital encounter of 02/23/14 (from the past 24 hour(s))  URINALYSIS, ROUTINE W REFLEX MICROSCOPIC     Status: Abnormal   Collection Time    02/23/14  9:06 PM      Result Value Ref Range   Color, Urine YELLOW  YELLOW   APPearance CLEAR  CLEAR   Specific Gravity, Urine 1.029  1.005 - 1.030   pH 6.0  5.0 - 8.0   Glucose, UA NEGATIVE  NEGATIVE mg/dL   Hgb urine dipstick MODERATE (*) NEGATIVE   Bilirubin Urine NEGATIVE  NEGATIVE   Ketones, ur NEGATIVE  NEGATIVE mg/dL   Protein, ur NEGATIVE  NEGATIVE mg/dL   Urobilinogen, UA 1.0  0.0 - 1.0 mg/dL   Nitrite NEGATIVE  NEGATIVE   Leukocytes, UA NEGATIVE  NEGATIVE  PREGNANCY, URINE     Status: Abnormal   Collection Time    02/23/14  9:06 PM      Result Value Ref Range   Preg Test, Ur POSITIVE (*) NEGATIVE  URINE MICROSCOPIC-ADD ON     Status: None   Collection Time    02/23/14  9:06 PM      Result Value Ref Range   Squamous Epithelial / LPF RARE  RARE   RBC / HPF 3-6  <3 RBC/hpf   Bacteria, UA RARE  RARE  CBC WITH DIFFERENTIAL     Status: Abnormal   Collection Time    02/23/14  9:40 PM      Result Value Ref Range   WBC 8.3  4.0 - 10.5 K/uL   RBC 3.90  3.87 - 5.11 MIL/uL   Hemoglobin 11.9 (*) 12.0 - 15.0 g/dL   HCT 38.7 (*) 56.4 - 33.2 %   MCV 88.7  78.0 - 100.0 fL   MCH 30.5  26.0 - 34.0 pg   MCHC 34.4  30.0 - 36.0 g/dL   RDW 95.1  88.4 - 16.6 %   Platelets 242  150 - 400 K/uL   Neutrophils Relative % 70  43 - 77 %   Neutro Abs 5.9  1.7 - 7.7 K/uL   Lymphocytes Relative 20  12 - 46 %   Lymphs Abs 1.7  0.7 - 4.0 K/uL   Monocytes Relative 7  3 - 12 %   Monocytes Absolute 0.6  0.1 - 1.0 K/uL   Eosinophils Relative 2  0 - 5 %   Eosinophils Absolute 0.2  0.0 - 0.7 K/uL   Basophils Relative 0  0 - 1 %   Basophils Absolute 0.0  0.0 - 0.1 K/uL  COMPREHENSIVE METABOLIC PANEL     Status: Abnormal    Collection Time    02/23/14  9:40 PM      Result Value Ref Range   Sodium 141  137 - 147 mEq/L   Potassium 3.5 (*) 3.7 - 5.3 mEq/L   Chloride 105  96 - 112 mEq/L   CO2 25  19 -  32 mEq/L   Glucose, Bld 101 (*) 70 - 99 mg/dL   BUN 9  6 - 23 mg/dL   Creatinine, Ser 9.14  0.50 - 1.10 mg/dL   Calcium 9.0  8.4 - 78.2 mg/dL   Total Protein 7.2  6.0 - 8.3 g/dL   Albumin 3.4 (*) 3.5 - 5.2 g/dL   AST 13  0 - 37 U/L   ALT 11  0 - 35 U/L   Alkaline Phosphatase 55  39 - 117 U/L   Total Bilirubin <0.2 (*) 0.3 - 1.2 mg/dL   GFR calc non Af Amer >90  >90 mL/min   GFR calc Af Amer >90  >90 mL/min  HCG, QUANTITATIVE, PREGNANCY     Status: Abnormal   Collection Time    02/23/14  9:40 PM      Result Value Ref Range   hCG, Beta Chain, Quant, S 1679 (*) <5 mIU/mL    ED Course: consult with Dr. Alberteen Sam on call at [facility]  Procedures   MDM  33 y.o. female with heavy vaginal bleeding and abdominal pain in pregnancy. [facility]. Patient stable for discharge. Discussed with the patient and all questioned fully answered.     [place], [place] 02/23/14 [phone]

## 2014-02-23 NOTE — ED Notes (Signed)
Pt with large amount of vaginal bleeding-has soaked thru pad

## 2014-02-23 NOTE — ED Notes (Signed)
Pt states that she thinks she is around [redacted] weeks pregnant, started having cramping and bleeding today aroun 1630. 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

## 2014-02-24 ENCOUNTER — Encounter (HOSPITAL_COMMUNITY): Payer: Self-pay | Admitting: *Deleted

## 2014-02-24 ENCOUNTER — Inpatient Hospital Stay (HOSPITAL_COMMUNITY): Payer: Commercial Managed Care - PPO

## 2014-02-24 DIAGNOSIS — O036 Delayed or excessive hemorrhage following complete or unspecified spontaneous abortion: Secondary | ICD-10-CM

## 2014-02-24 LAB — CBC
HCT: 32.3 % — ABNORMAL LOW (ref 36.0–46.0)
HEMOGLOBIN: 11.1 g/dL — AB (ref 12.0–15.0)
MCH: 29.4 pg (ref 26.0–34.0)
MCHC: 34.4 g/dL (ref 30.0–36.0)
MCV: 85.7 fL (ref 78.0–100.0)
Platelets: 223 10*3/uL (ref 150–400)
RBC: 3.77 MIL/uL — AB (ref 3.87–5.11)
RDW: 12.5 % (ref 11.5–15.5)
WBC: 8.2 10*3/uL (ref 4.0–10.5)

## 2014-02-24 LAB — ABO/RH: ABO/RH(D): AB POS

## 2014-02-24 MED ORDER — MISOPROSTOL 200 MCG PO TABS
800.0000 ug | ORAL_TABLET | Freq: Once | ORAL | Status: AC
  Administered 2014-02-24: 800 ug via RECTAL
  Filled 2014-02-24: qty 4

## 2014-02-24 MED ORDER — HYDROMORPHONE HCL PF 1 MG/ML IJ SOLN
1.0000 mg | INTRAMUSCULAR | Status: DC | PRN
  Administered 2014-02-24 (×2): 1 mg via INTRAVENOUS
  Filled 2014-02-24 (×2): qty 1

## 2014-02-24 MED ORDER — METHYLERGONOVINE MALEATE 0.2 MG PO TABS: 0.2000 mg | ORAL_TABLET | Freq: Four times a day (QID) | ORAL | Status: AC

## 2014-02-24 MED ORDER — SODIUM CHLORIDE 0.9 % IV SOLN
INTRAVENOUS | Status: DC
  Administered 2014-02-24: 03:00:00 via INTRAVENOUS

## 2014-02-24 NOTE — MAU Note (Signed)
Pt up to bathroom. Bleeding is decreased form initial assessment, but to still small-moderate. Ivonne AndrewV Smith made aware

## 2014-02-24 NOTE — Discharge Instructions (Signed)
Miscarriage A miscarriage is the sudden loss of an unborn baby (fetus) before the 20th week of pregnancy. Most miscarriages happen in the first 3 months of pregnancy. Sometimes, it happens before a woman even knows she is pregnant. A miscarriage is also called a "spontaneous miscarriage" or "early pregnancy loss." Having a miscarriage can be an emotional experience. Talk with your caregiver about any questions you may have about miscarrying, the grieving process, and your future pregnancy plans. CAUSES   Problems with the fetal chromosomes that make it impossible for the baby to develop normally. Problems with the baby's genes or chromosomes are most often the result of errors that occur, by chance, as the embryo divides and grows. The problems are not inherited from the parents.  Infection of the cervix or uterus.   Hormone problems.   Problems with the cervix, such as having an incompetent cervix. This is when the tissue in the cervix is not strong enough to hold the pregnancy.   Problems with the uterus, such as an abnormally shaped uterus, uterine fibroids, or congenital abnormalities.   Certain medical conditions.   Smoking, drinking alcohol, or taking illegal drugs.   Trauma.  Often, the cause of a miscarriage is unknown.  SYMPTOMS   Vaginal bleeding or spotting, with or without cramps or pain.  Pain or cramping in the abdomen or lower back.  Passing fluid, tissue, or blood clots from the vagina. DIAGNOSIS  Your caregiver will perform a physical exam. You may also have an ultrasound to confirm the miscarriage. Blood or urine tests may also be ordered. TREATMENT   Sometimes, treatment is not necessary if you naturally pass all the fetal tissue that was in the uterus. If some of the fetus or placenta remains in the body (incomplete miscarriage), tissue left behind may become infected and must be removed. Usually, a dilation and curettage (D and C) procedure is performed.  During a D and C procedure, the cervix is widened (dilated) and any remaining fetal or placental tissue is gently removed from the uterus.  Antibiotic medicines are prescribed if there is an infection. Other medicines may be given to reduce the size of the uterus (contract) if there is a lot of bleeding.  If you have Rh negative blood and your baby was Rh positive, you will need a Rh immunoglobulin shot. This shot will protect any future baby from having Rh blood problems in future pregnancies. HOME CARE INSTRUCTIONS   Your caregiver may order bed rest or may allow you to continue light activity. Resume activity as directed by your caregiver.  Have someone help with home and family responsibilities during this time.   Keep track of the number of sanitary pads you use each day and how soaked (saturated) they are. Write down this information.   Do not use tampons. Do not douche or have sexual intercourse until approved by your caregiver.   Only take over-the-counter or prescription medicines for pain or discomfort as directed by your caregiver.   Do not take aspirin. Aspirin can cause bleeding.   Keep all follow-up appointments with your caregiver.   If you or your partner have problems with grieving, talk to your caregiver or seek counseling to help cope with the pregnancy loss. Allow enough time to grieve before trying to get pregnant again.  SEEK IMMEDIATE MEDICAL CARE IF:   You have severe cramps or pain in your back or abdomen.  You have a fever.  You pass large blood clots (walnut-sized   or larger) ortissue from your vagina. Save any tissue for your caregiver to inspect.   Your bleeding increases.   You have a thick, bad-smelling vaginal discharge.  You become lightheaded, weak, or you faint.   You have chills.  MAKE SURE YOU:  Understand these instructions.  Will watch your condition.  Will get help right away if you are not doing well or get  worse. Document Released: 06/03/2001 Document Revised: 04/04/2013 Document Reviewed: 01/27/2012 Exit[employer] Patient Information 2014 [employer].  

## 2014-02-24 NOTE — MAU Provider Note (Signed)
Attestation of Attending Supervision of Advanced Practitioner (CNM/NP): Evaluation and management procedures were performed by the Advanced Practitioner under my supervision and collaboration. I have reviewed the Advanced Practitioner's note and chart, and I agree with the management and plan.  Abigail Marsiglia H. 8:30 AM

## 2014-02-24 NOTE — MAU Note (Signed)
Pt transferred from [facility] for bleeding and miscarriage.

## 2014-02-24 NOTE — MAU Provider Note (Signed)
Chief Complaint: Abdominal Cramping and Possible Pregnancy   First Provider Initiated Contact with Patient 02/24/14 0023     SUBJECTIVE HPI: Shelby Campos is a 33 y.o. G1P0 at Unknown by LMP who presents in maternity admissions by [facility] from [facility] with SAB in progress, heavy bleeding, cramping. Passed fetus at [facility]. Sent to maternity admissions for ultrasound and further management of bleeding.  Past Medical History  Diagnosis Date  . 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 UTI (urinary tract infection)    OB History  Gravida Para Term Preterm AB SAB TAB Ectopic Multiple Living  4 3 1 2      3     # Outcome Date GA Lbr Len/2nd Weight Sex Delivery Anes PTL Lv  4 CUR           3 PRE 2009 [redacted]w[redacted]d   M CS Spinal  Y     Comments: Oligo stat c/s  2 PRE 2006 [redacted]w[redacted]d   F SVD   Y     Comments: preeclampsia  1 TRM 2002 [redacted]w[redacted]d    SVD   Y     Past Surgical History  Procedure Laterality Date  . Cesarean section  2009    low transverse for NRFHTs   History   Social History  . Marital Status: Single    Spouse Name: N/A    Number of Children: N/A  . Years of Education: N/A   Occupational History  . Not on file.   Social History Main Topics  . Smoking status: Former Games developer  . Smokeless tobacco: Former Neurosurgeon    Quit date: 08/20/2008  . Alcohol Use: Yes     Comment: monthly  . Drug Use: No  . Sexual Activity: Not on file   Other Topics Concern  . Not on file   Social History Narrative  . No narrative on file   No current facility-administered medications on file prior to encounter.   Current Outpatient Prescriptions on File Prior to Encounter  Medication Sig Dispense Refill  . ciprofloxacin (CIPRO) 500 MG tablet Take 1 tablet (500 mg total) by mouth 2 (two) times daily.  10 tablet  0   No Known Allergies  ROS: Pertinent items in HPI  OBJECTIVE Blood pressure 98/50, pulse 78, temperature 98.7 F (37.1 C), temperature  source Oral, resp. rate 18, last menstrual period 12/18/2013, SpO2 100.00%. Patient Vitals for the past 24 hrs:  BP Temp Temp src Pulse Resp SpO2  02/24/14 0340 98/50 mmHg 98.7 F (37.1 C) - 78 18 -  02/24/14 0219 102/61 mmHg - - 84 18 -  02/24/14 0021 110/74 mmHg 98 F (36.7 C) Oral 84 18 -    GENERAL: Well-developed, well-nourished female in mild distress. Color normal for race. HEENT: Normocephalic HEART: normal rate RESP: normal effort ABDOMEN: Soft, non-tender EXTREMITIES: Nontender, no edema NEURO: Alert and oriented SPECULUM EXAM: NEFG, bleeding moderate, cervix clean BIMANUAL: cervix 1 cm dilated; uterus 8 week size, no adnexal tenderness or masses  LAB RESULTS Results for orders placed during the hospital encounter of 02/23/14 (from the past 24 hour(s))  URINALYSIS, ROUTINE W REFLEX MICROSCOPIC     Status: Abnormal   Collection Time    02/23/14  9:06 PM      Result Value Ref Range   Color, Urine YELLOW  YELLOW   APPearance CLEAR  CLEAR   Specific Gravity, Urine 1.029  1.005 - 1.030   pH 6.0  5.0 - 8.0  Glucose, UA NEGATIVE  NEGATIVE mg/dL   Hgb urine dipstick MODERATE (*) NEGATIVE   Bilirubin Urine NEGATIVE  NEGATIVE   Ketones, ur NEGATIVE  NEGATIVE mg/dL   Protein, ur NEGATIVE  NEGATIVE mg/dL   Urobilinogen, UA 1.0  0.0 - 1.0 mg/dL   Nitrite NEGATIVE  NEGATIVE   Leukocytes, UA NEGATIVE  NEGATIVE  PREGNANCY, URINE     Status: Abnormal   Collection Time    02/23/14  9:06 PM      Result Value Ref Range   Preg Test, Ur POSITIVE (*) NEGATIVE  URINE MICROSCOPIC-ADD ON     Status: None   Collection Time    02/23/14  9:06 PM      Result Value Ref Range   Squamous Epithelial / LPF RARE  RARE   RBC / HPF 3-6  <3 RBC/hpf   Bacteria, UA RARE  RARE  CBC WITH DIFFERENTIAL     Status: Abnormal   Collection Time    02/23/14  9:40 PM      Result Value Ref Range   WBC 8.3  4.0 - 10.5 K/uL   RBC 3.90  3.87 - 5.11 MIL/uL   Hemoglobin 11.9 (*) 12.0 - 15.0 g/dL   HCT  08.6 (*) 57.8 - 46.0 %   MCV 88.7  78.0 - 100.0 fL   MCH 30.5  26.0 - 34.0 pg   MCHC 34.4  30.0 - 36.0 g/dL   RDW 46.9  62.9 - 52.8 %   Platelets 242  150 - 400 K/uL   Neutrophils Relative % 70  43 - 77 %   Neutro Abs 5.9  1.7 - 7.7 K/uL   Lymphocytes Relative 20  12 - 46 %   Lymphs Abs 1.7  0.7 - 4.0 K/uL   Monocytes Relative 7  3 - 12 %   Monocytes Absolute 0.6  0.1 - 1.0 K/uL   Eosinophils Relative 2  0 - 5 %   Eosinophils Absolute 0.2  0.0 - 0.7 K/uL   Basophils Relative 0  0 - 1 %   Basophils Absolute 0.0  0.0 - 0.1 K/uL  COMPREHENSIVE METABOLIC PANEL     Status: Abnormal   Collection Time    02/23/14  9:40 PM      Result Value Ref Range   Sodium 141  137 - 147 mEq/L   Potassium 3.5 (*) 3.7 - 5.3 mEq/L   Chloride 105  96 - 112 mEq/L   CO2 25  19 - 32 mEq/L   Glucose, Bld 101 (*) 70 - 99 mg/dL   BUN 9  6 - 23 mg/dL   Creatinine, Ser 4.13  0.50 - 1.10 mg/dL   Calcium 9.0  8.4 - 24.4 mg/dL   Total Protein 7.2  6.0 - 8.3 g/dL   Albumin 3.4 (*) 3.5 - 5.2 g/dL   AST 13  0 - 37 U/L   ALT 11  0 - 35 U/L   Alkaline Phosphatase 55  39 - 117 U/L   Total Bilirubin <0.2 (*) 0.3 - 1.2 mg/dL   GFR calc non Af Amer >90  >90 mL/min   GFR calc Af Amer >90  >90 mL/min  HCG, QUANTITATIVE, PREGNANCY     Status: Abnormal   Collection Time    02/23/14  9:40 PM      Result Value Ref Range   hCG, Beta Chain, Quant, S 1679 (*) <5 mIU/mL  ABO/RH     Status: None   Collection Time  02/23/14 10:20 PM      Result Value Ref Range   ABO/RH(D) AB POS     No rh immune globuloin       Value: NOT A RH IMMUNE GLOBULIN CANDIDATE, PT RH POSITIVE     [facility]  ABO/RH     Status: None   Collection Time    02/24/14 12:57 AM      Result Value Ref Range   ABO/RH(D) AB POS    CBC     Status: Abnormal   Collection Time    02/24/14 12:57 AM      Result Value Ref Range   WBC 8.2  4.0 - 10.5 K/uL   RBC 3.77 (*) 3.87 - 5.11 MIL/uL   Hemoglobin 11.1 (*) 12.0 - 15.0 g/dL   HCT  16.132.3 (*) 09.636.0 - 46.0 %   MCV 85.7  78.0 - 100.0 fL   MCH 29.4  26.0 - 34.0 pg   MCHC 34.4  30.0 - 36.0 g/dL   RDW 04.512.5  40.911.5 - 81.115.5 %   Platelets 223  150 - 400 K/uL    IMAGING No results found.  MAU COURSE Repeat CBC, type and screen. Ultrasound ordered. N.p.o.  0220: Passed large clot in ultrasound. Moderate gush of blood and clots in room. On sterile speculum exam moderate clot removed from vagina and small amount of tissue removed from external os. Bleeding small amount afterwards. Cytotec placed rectally. Will continue to observe 1 more hour and discharged home if bleeding stable.  [id number]: Continued moderate gushes of blood w/ clots. Ambulating w/out dizziness. Speculum exam repeated. 3X6 centimeter, intact? Placental tissue and membranes removed from os. Scant bleeding to follow. Lengthy discussion with patient about various causes of miscarriage. Patient wants to know if this will happen she tries to get pregnant again. Informed her that it is unlikely that that risk can be calculated with much uncertainty. Offered to have fetus sent for chromosomal studies and offered genetic counseling appointment. Patient declines.  0530: Bleeding small amount. Feels ready to go home. Cramping resolved.   ASSESSMENT 1. Vaginal bleeding before [redacted] weeks gestation    PLAN Discharge home in stable condition. Bleeding precautions. Increase fluids and rest. Support given. Offered chaplain, declined. Work note given Follow-up Information   Follow up with [facility] In 1 week. (As scheduled or, As needed, If symptoms worsen)       [facility]. (As needed in emergencies)    Contact information:   [address] [id number] [place] [id number] [phone]       Medication List    STOP taking these medications       ciprofloxacin 500 MG tablet  Commonly known as:  CIPRO      TAKE these medications        methylergonovine 0.2 MG tablet  Commonly known as:  METHERGINE  Take 1 tablet (0.2 mg total) by mouth every 6 (six) hours.       [place] Col[place], CNM 02/24/2014  5:42 AM

## 2014-03-01 NOTE — ED Provider Notes (Signed)
Medical screening examination/treatment/procedure(s) were conducted as a shared visit with non-physician practitioner(s) and myself.  I personally evaluated the patient during the encounter.   EKG Interpretation None      Shelby Campos is a 33 y.o. female [redacted] weeks pregnant here with abdominal pain and vaginal bleeding. Cramping this AM, doesn't have documented IUP. HCG was 1679 in the ED. The NP did pelvic exam and she is having active bleeding. Bedside US showed clots in uterus, no obvious IUP. Patient bleed about 4 pads in the ED. [facility]  EMERGENCY DEPARTMENT US PREGNANCY "Study: Limited Ultrasound of the Pelvis"  INDICATIONS:Pregnancy(required) and Vaginal bleeding Multiple views of the uterus and pelvic cavity are obtained with a multi-frequency probe.  APPROACH:Transabdominal   PERFORMED BY: Myself  IMAGES ARCHIVED?: No  LIMITATIONS: Emergent procedure  PREGNANCY FREE FLUID: None  PREGNANCY UTERUS FINDINGS:Uterus enlarged and Non-specific intrauterine fluid noted ADNEXAL FINDINGS:Left ovary not seen and Right ovary not seen  PREGNANCY FINDINGS: No yolk sac noted and No fetal pole seen  INTERPRETATION: No visualized intrauterine pregnancy    COMMENT(Estimate of Gestational Age):  There are clots in the uterus. No obvious IUP.      Shelby Canalavid H Lavoris Canizales, MD 03/01/14 [id number]

## 2014-07-16 IMAGING — US US OB COMP LESS 14 WK
1 series · 14 of 28 positions shown · non-contrast
Comparison: 03/29/2013.

CLINICAL DATA: Status post spontaneous abortion. Bleeding and pain.

EXAM:
OBSTETRIC <14 WK US AND TRANSVAGINAL OB US
TECHNIQUE: Both transabdominal and transvaginal ultrasound examinations were
performed for complete evaluation of the gestation as well as the
maternal uterus, adnexal regions, and pelvic cul-de-sac.
Transvaginal technique was performed to assess early pregnancy.

[Series 1: us ob comp less 14 wks · 14 of 57 slices shown]
[im 3/57]
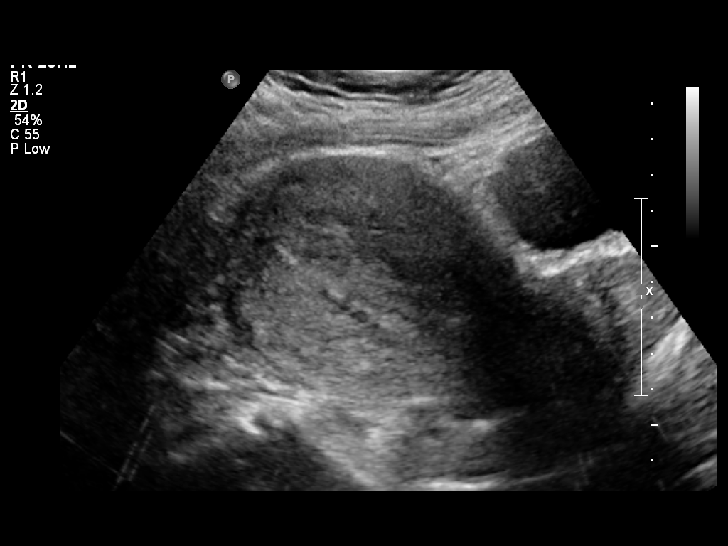
[im 7/57]
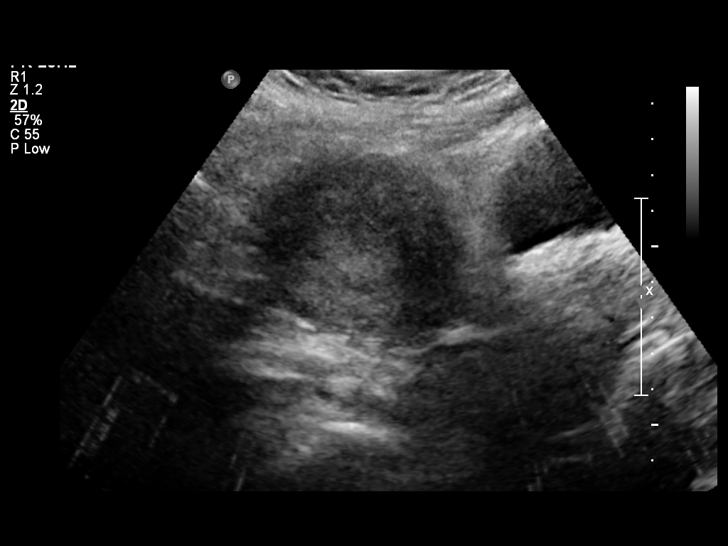
[im 11/57]
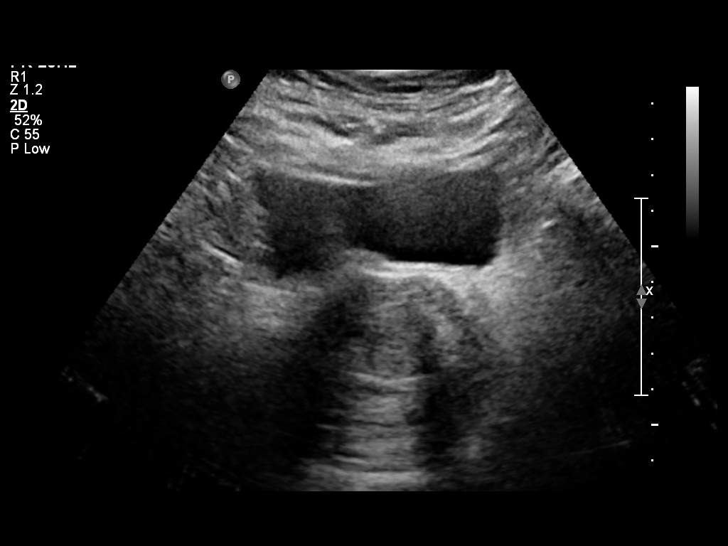
[im 15/57]
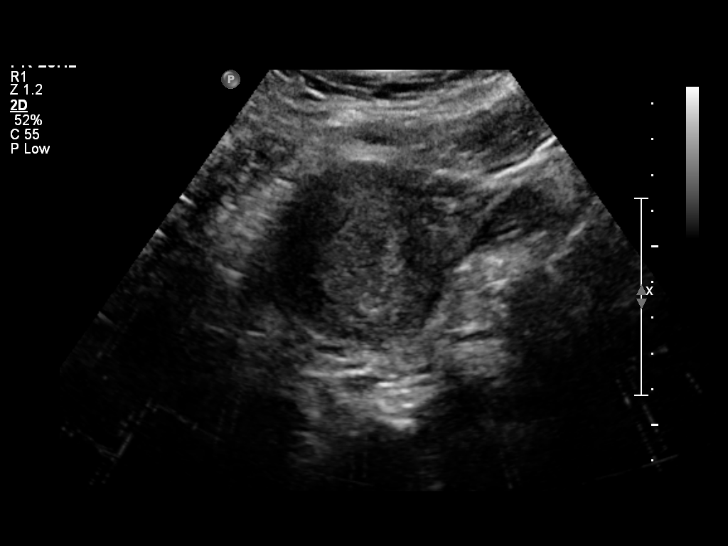
[im 19/57]
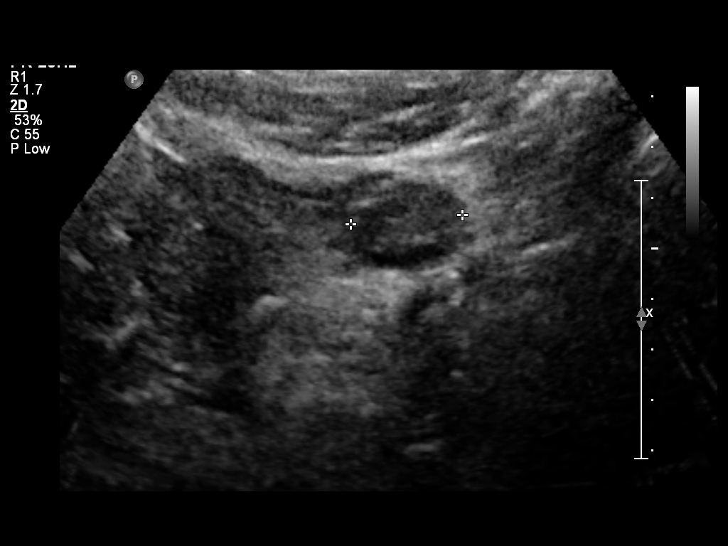
[im 23/57]
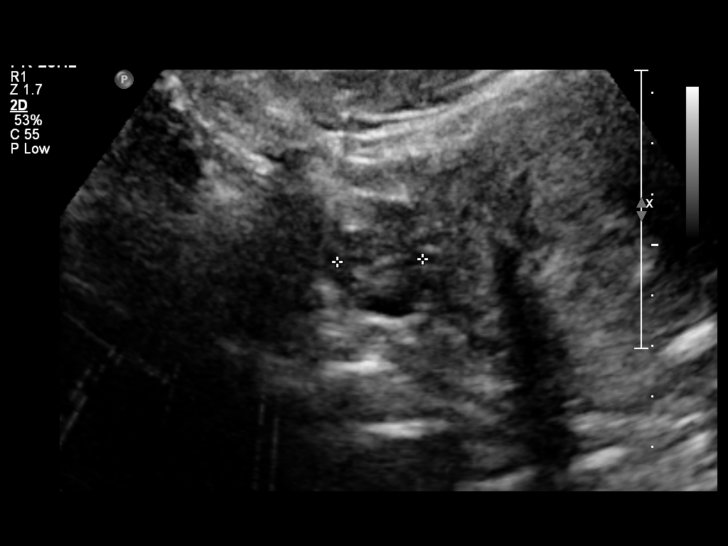
[im 27/57]
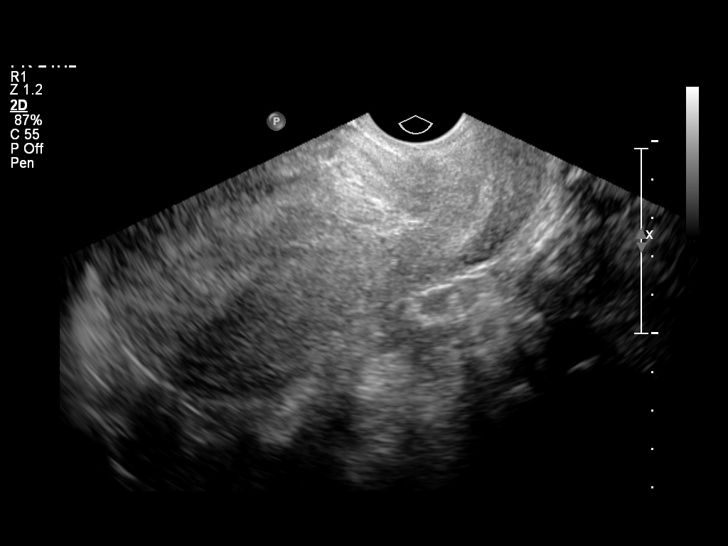
[im 32/57]
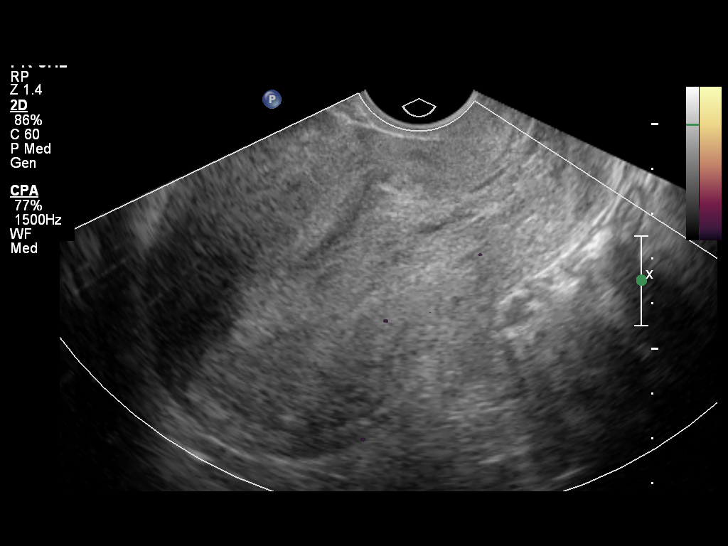
[im 36/57]
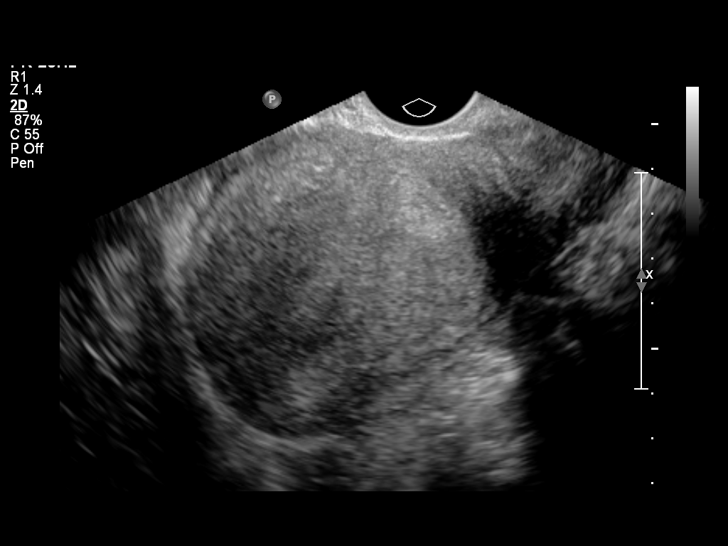
[im 40/57]
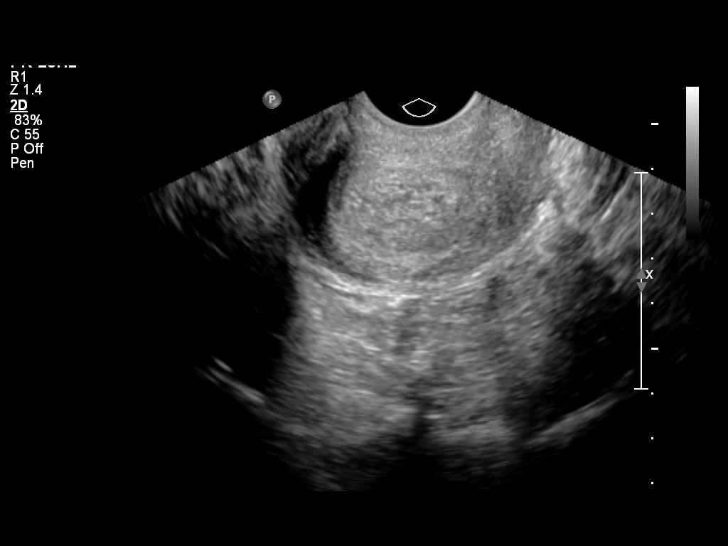
[im 44/57]
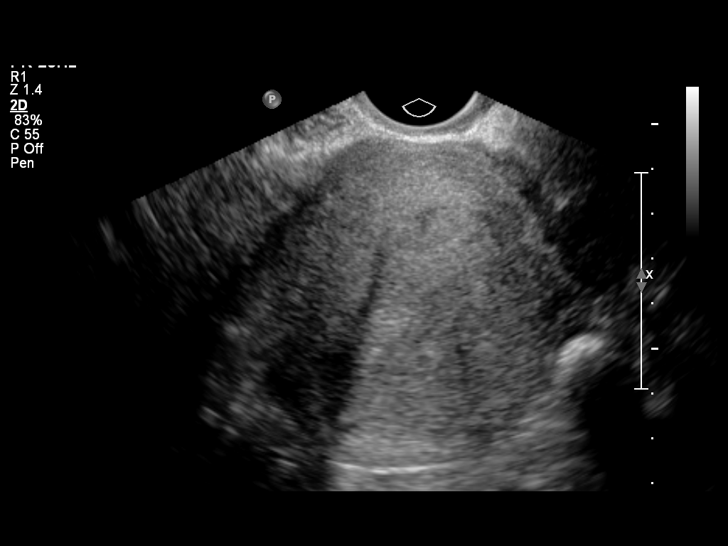
[im 48/57]
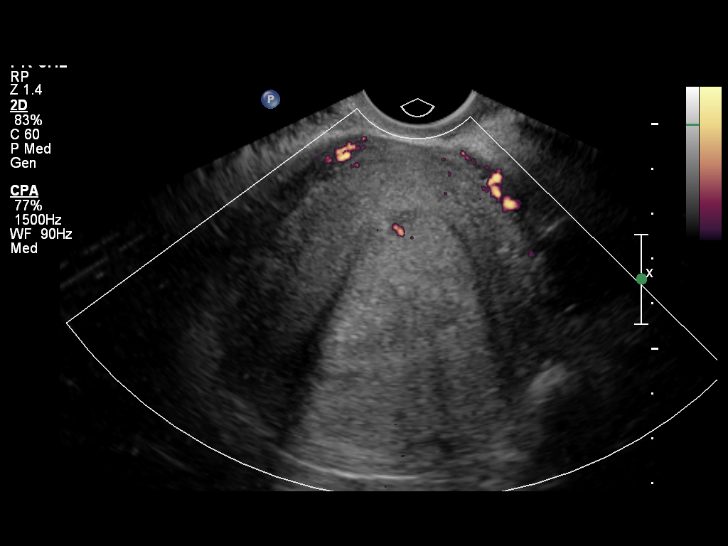
[im 52/57]
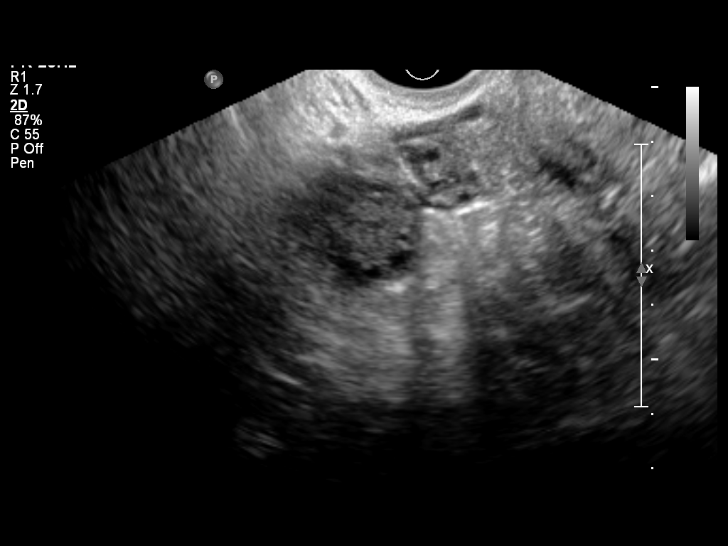
[im 57/57]
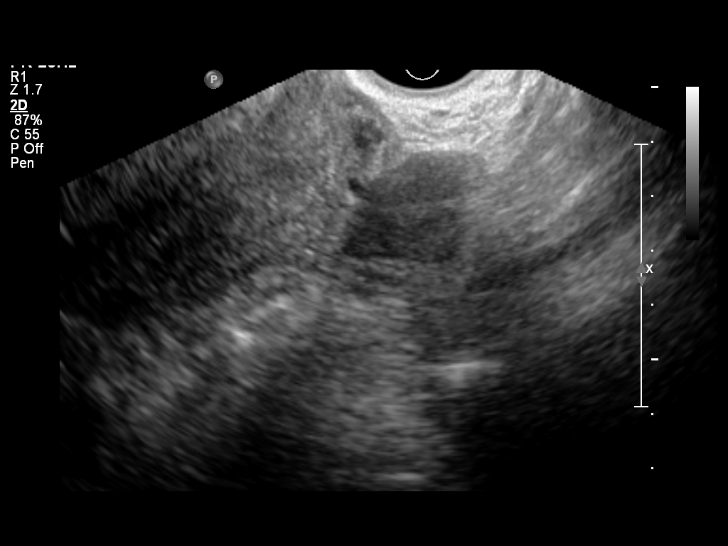

[14 of 28 positions shown; findings below may reference images not displayed]

FINDINGS: Intrauterine gestational sac: None visualized.

Yolk sac:  None visualized.

Embryo:  None visualized.

Cardiac Activity: None visualized.

Endometrial lining thickness 13.1 cm.

Maternal uterus/adnexae: Unremarkable.
IMPRESSION: No intrauterine pregnancy or retained products of conception
visualized.

Correlation with serial beta hCGs and clinical exam recommended.
Follow-up ultrasound recommended if there are progressive clinical
symptoms or elevated beta HCG.

## 2014-08-24 ENCOUNTER — Emergency Department (HOSPITAL_BASED_OUTPATIENT_CLINIC_OR_DEPARTMENT_OTHER): Payer: Commercial Managed Care - PPO

## 2014-08-24 ENCOUNTER — Emergency Department (HOSPITAL_BASED_OUTPATIENT_CLINIC_OR_DEPARTMENT_OTHER)
Admission: EM | Admit: 2014-08-24 | Discharge: 2014-08-24 | Disposition: A | Discharge status: Home / Self Care | Payer: Commercial Managed Care - PPO | Attending: Emergency Medicine | Admitting: Emergency Medicine

## 2014-08-24 ENCOUNTER — Encounter (HOSPITAL_BASED_OUTPATIENT_CLINIC_OR_DEPARTMENT_OTHER): Payer: Self-pay | Admitting: Emergency Medicine

## 2014-08-24 DIAGNOSIS — Z8659 Personal history of other mental and behavioral disorders: Secondary | ICD-10-CM | POA: Diagnosis not present

## 2014-08-24 DIAGNOSIS — B9689 Other specified bacterial agents as the cause of diseases classified elsewhere: Secondary | ICD-10-CM | POA: Diagnosis not present

## 2014-08-24 DIAGNOSIS — O209 Hemorrhage in early pregnancy, unspecified: Secondary | ICD-10-CM | POA: Insufficient documentation

## 2014-08-24 DIAGNOSIS — Z87891 Personal history of nicotine dependence: Secondary | ICD-10-CM | POA: Diagnosis not present

## 2014-08-24 DIAGNOSIS — N760 Acute vaginitis: Secondary | ICD-10-CM | POA: Insufficient documentation

## 2014-08-24 DIAGNOSIS — Z9889 Other specified postprocedural states: Secondary | ICD-10-CM | POA: Diagnosis not present

## 2014-08-24 DIAGNOSIS — Z87440 Personal history of urinary (tract) infections: Secondary | ICD-10-CM | POA: Diagnosis not present

## 2014-08-24 DIAGNOSIS — A499 Bacterial infection, unspecified: Secondary | ICD-10-CM | POA: Insufficient documentation

## 2014-08-24 DIAGNOSIS — O2390 Unspecified genitourinary tract infection in pregnancy, unspecified trimester: Secondary | ICD-10-CM | POA: Insufficient documentation

## 2014-08-24 LAB — URINALYSIS, ROUTINE W REFLEX MICROSCOPIC
Bilirubin Urine: NEGATIVE
Glucose, UA: NEGATIVE mg/dL
Ketones, ur: NEGATIVE mg/dL
LEUKOCYTES UA: NEGATIVE
Nitrite: NEGATIVE
PH: 6 (ref 5.0–8.0)
Protein, ur: NEGATIVE mg/dL
Specific Gravity, Urine: 1.019 (ref 1.005–1.030)
Urobilinogen, UA: 1 mg/dL (ref 0.0–1.0)

## 2014-08-24 LAB — WET PREP, GENITAL
Trich, Wet Prep: NONE SEEN
YEAST WET PREP: NONE SEEN

## 2014-08-24 LAB — CBC WITH DIFFERENTIAL/PLATELET
BASOS PCT: 0 % (ref 0–1)
Basophils Absolute: 0 10*3/uL (ref 0.0–0.1)
EOS ABS: 0.2 10*3/uL (ref 0.0–0.7)
Eosinophils Relative: 2 % (ref 0–5)
HCT: 33.9 % — ABNORMAL LOW (ref 36.0–46.0)
HEMOGLOBIN: 11.8 g/dL — AB (ref 12.0–15.0)
Lymphocytes Relative: 22 % (ref 12–46)
Lymphs Abs: 2 10*3/uL (ref 0.7–4.0)
MCH: 29.5 pg (ref 26.0–34.0)
MCHC: 34.8 g/dL (ref 30.0–36.0)
MCV: 84.8 fL (ref 78.0–100.0)
Monocytes Absolute: 0.7 10*3/uL (ref 0.1–1.0)
Monocytes Relative: 8 % (ref 3–12)
NEUTROS PCT: 68 % (ref 43–77)
Neutro Abs: 6.2 10*3/uL (ref 1.7–7.7)
Platelets: 278 10*3/uL (ref 150–400)
RBC: 4 MIL/uL (ref 3.87–5.11)
RDW: 12.7 % (ref 11.5–15.5)
WBC: 9.1 10*3/uL (ref 4.0–10.5)

## 2014-08-24 LAB — URINE MICROSCOPIC-ADD ON

## 2014-08-24 LAB — PREGNANCY, URINE: Preg Test, Ur: POSITIVE — AB

## 2014-08-24 LAB — HCG, QUANTITATIVE, PREGNANCY: hCG, Beta Chain, Quant, S: 43937 m[IU]/mL — ABNORMAL HIGH (ref <5)

## 2014-08-24 NOTE — ED Provider Notes (Signed)
CSN: [id number]     Arrival date & time 08/24/14  1717 History   First MD Initiated Contact with Patient 08/24/14 1753     Chief Complaint  Patient presents with  . Vaginal Bleeding     (Consider location/radiation/quality/duration/timing/severity/associated sxs/prior Treatment) Patient is a 33 y.o. female presenting with vaginal bleeding. The history is provided by the patient.  Vaginal Bleeding She is approximately [redacted] weeks pregnant by last menstrual period of July 7. She had onset of vaginal bleeding this afternoon. She does not think it is enough blood to saturate a pad and there've been no clots. There's been no cramping. She is concerned because of history of miscarriages. She is gravida 6, para 3 with 2 miscarriages. She has not started prenatal care for this pregnancy.  Past Medical History  Diagnosis Date  . 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 UTI (urinary tract infection)    Past Surgical History  Procedure Laterality Date  . Cesarean section  2009    low transverse for NRFHTs   Family History  Problem Relation Age of Onset  . ADD / ADHD Mother   . Depression Mother   . ADD / ADHD Brother    History  Substance Use Topics  . Smoking status: Former Games developer  . Smokeless tobacco: Former Neurosurgeon    Quit date: 08/20/2008  . Alcohol Use: Yes     Comment: monthly   OB History   Grav Para Term Preterm Abortions TAB SAB Ect Mult Living   Review of Systems  Genitourinary: Positive for vaginal bleeding.  All other systems reviewed and are negative.     Allergies  Review of patient's allergies indicates no known allergies.  Home Medications   Prior to Admission medications   Not on File   BP 131/85  Pulse 82  Temp(Src) 99.9 F (37.7 C) (Oral)  Resp 16  Ht  (1.575 m)  Wt 175 lb (79.379 kg)  BMI 32.00 kg/m2  SpO2 100%  LMP 07/10/2014 Physical Exam  Nursing note and vitals reviewed.  33 year old  female, resting comfortably and in no acute distress. Vital signs are normal. Oxygen saturation is 100%, which is normal. Head is normocephalic and atraumatic. PERRLA, EOMI. Oropharynx is clear. Neck is nontender and supple without adenopathy or JVD. Back is nontender and there is no CVA tenderness. Lungs are clear without rales, wheezes, or rhonchi. Chest is nontender. Heart has regular rate and rhythm without murmur. Abdomen is soft, flat, nontender without masses or hepatosplenomegaly and peristalsis is normoactive. Pelvic: Normal external female genitalia. Small moderate amount of brownish discharge present without any evidence of bleeding. Cervix is closed. Fundus is 6-8 weeks size and nontender. Is there cervical motion tenderness. There are no adnexal masses or tenderness. Extremities have no cyanosis or edema, full range of motion is present. Skin is warm and dry without rash. Neurologic: Mental status is normal, cranial nerves are intact, there are no motor or sensory deficits.  ED Course  Procedures (including critical care time) Labs Review Results for orders placed during the hospital encounter of 08/24/14  WET PREP, GENITAL      Result Value Ref Range   Yeast Wet Prep HPF POC NONE SEEN  NONE SEEN   Trich, Wet Prep NONE SEEN  NONE SEEN   Clue Cells Wet Prep HPF POC MODERATE (*) NONE SEEN   WBC, Wet Prep  HPF POC MODERATE (*) NONE SEEN  URINALYSIS, ROUTINE W REFLEX MICROSCOPIC      Result Value Ref Range   Color, Urine YELLOW  YELLOW   APPearance CLEAR  CLEAR   Specific Gravity, Urine 1.019  1.005 - 1.030   pH 6.0  5.0 - 8.0   Glucose, UA NEGATIVE  NEGATIVE mg/dL   Hgb urine dipstick MODERATE (*) NEGATIVE   Bilirubin Urine NEGATIVE  NEGATIVE   Ketones, ur NEGATIVE  NEGATIVE mg/dL   Protein, ur NEGATIVE  NEGATIVE mg/dL   Urobilinogen, UA 1.0  0.0 - 1.0 mg/dL   Nitrite NEGATIVE  NEGATIVE   Leukocytes, UA NEGATIVE  NEGATIVE  PREGNANCY, URINE      Result Value Ref Range    Preg Test, Ur POSITIVE (*) NEGATIVE  URINE MICROSCOPIC-ADD ON      Result Value Ref Range   Squamous Epithelial / LPF FEW (*) RARE   WBC, UA 0-2  <3 WBC/hpf   RBC / HPF 7-10  <3 RBC/hpf   Bacteria, UA MANY (*) RARE  CBC WITH DIFFERENTIAL      Result Value Ref Range   WBC 9.1  4.0 - 10.5 K/uL   RBC 4.00  3.87 - 5.11 MIL/uL   Hemoglobin 11.8 (*) 12.0 - 15.0 g/dL   HCT 81.1 (*) 91.4 - 78.2 %   MCV 84.8  78.0 - 100.0 fL   MCH 29.5  26.0 - 34.0 pg   MCHC 34.8  30.0 - 36.0 g/dL   RDW 95.6  21.3 - 08.6 %   Platelets 278  150 - 400 K/uL   Neutrophils Relative % 68  43 - 77 %   Neutro Abs 6.2  1.7 - 7.7 K/uL   Lymphocytes Relative 22  12 - 46 %   Lymphs Abs 2.0  0.7 - 4.0 K/uL   Monocytes Relative 8  3 - 12 %   Monocytes Absolute 0.7  0.1 - 1.0 K/uL   Eosinophils Relative 2  0 - 5 %   Eosinophils Absolute 0.2  0.0 - 0.7 K/uL   Basophils Relative 0  0 - 1 %   Basophils Absolute 0.0  0.0 - 0.1 K/uL  HCG, QUANTITATIVE, PREGNANCY      Result Value Ref Range   hCG, Beta Chain, Sharene Butters, [place] [id number] (*) <5 mIU/mL   Imaging Review US Ob Comp Less 14 Wks  08/24/2014   CLINICAL DATA:  Pregnancy, vaginal bleeding.  EXAM: OBSTETRIC <14 WK [place] AND TRANSVAGINAL OB US  TECHNIQUE: Both transabdominal and transvaginal ultrasound examinations were performed for complete evaluation of the gestation as well as the maternal uterus, adnexal regions, and pelvic cul-de-sac. Transvaginal technique was performed to assess early pregnancy.  COMPARISON:  None.  FINDINGS: MSD:  2.1  mm   7 w   0  d  Maternal uterus/adnexae: Not well visualized due to overlying bowel.  Yolk sac:  Visualized.  Embryo:  Visualized.  Cardiac Activity: Visualized.  Heart Rate:  131 bpm  IMPRESSION: Single live intrauterine gestation of 7 weeks 0 days.   Electronically Signed   By: Roque Lias M.D.   On: 08/24/2014 19:37   US Ob Transvaginal  08/24/2014   CLINICAL DATA:  Pregnancy, vaginal bleeding.  EXAM: OBSTETRIC <14 WK [place] AND TRANSVAGINAL OB  US  TECHNIQUE: Both transabdominal and transvaginal ultrasound examinations were performed for complete evaluation of the gestation as well as the maternal uterus, adnexal regions, and pelvic cul-de-sac. Transvaginal technique was performed to assess  early pregnancy.  COMPARISON:  None.  FINDINGS: MSD:  2.1  mm   7 w   0  d  Maternal uterus/adnexae: Not well visualized due to overlying bowel.  Yolk sac:  Visualized.  Embryo:  Visualized.  Cardiac Activity: Visualized.  Heart Rate:  131 bpm  IMPRESSION: Single live intrauterine gestation of 7 weeks 0 days.   Electronically Signed   By: Roque Lias M.D.   On: 08/24/2014 19:37    MDM   Final diagnoses:  First trimester bleeding  Bacterial vaginosis    First trimester bleeding. Ultrasound will be obtained to make sure that there is an intrauterine pregnancy. Old records are reviewed and she did have a spontaneous miscarriage 6 months ago.  Ultrasound confirms living intrauterine pregnancy at [redacted] weeks gestation. She will need to followup in 3 days for repeat hCG level.  Wet prep is positive for bacterial vaginosis. Decision on timing to treat this will be deferred to her OB/GYN physician.  Dione Booze, MD 08/24/14 2022

## 2014-08-24 NOTE — ED Notes (Signed)
Patient state that she had been bleeding for about an hour. Patient reports that she is having spotting in panties and wiped with more blood

## 2014-08-24 NOTE — Discharge Instructions (Signed)
Vaginal Bleeding During Pregnancy, First Trimester °A small amount of bleeding (spotting) from the vagina is relatively common in early pregnancy. It usually stops on its own. Various things may cause bleeding or spotting in early pregnancy. Some bleeding may be related to the pregnancy, and some may not. In most cases, the bleeding is normal and is not a problem. However, bleeding can also be a sign of something serious. Be sure to tell your health care provider about any vaginal bleeding right away. °Some possible causes of vaginal bleeding during the first trimester include: °· Infection or inflammation of the cervix. °· Growths (polyps) on the cervix. °· Miscarriage or threatened miscarriage. °· Pregnancy tissue has developed outside of the uterus and in a fallopian tube (tubal pregnancy). °· Tiny cysts have developed in the uterus instead of pregnancy tissue (molar pregnancy). °HOME CARE INSTRUCTIONS  °Watch your condition for any changes. The following actions may help to lessen any discomfort you are feeling: °· Follow your health care provider's instructions for limiting your activity. If your health care provider orders bed rest, you may need to stay in bed and only get up to use the bathroom. However, your health care provider may allow you to continue light activity. °· If needed, make plans for someone to help with your regular activities and responsibilities while you are on bed rest. °· Keep track of the number of pads you use each day, how often you change pads, and how soaked (saturated) they are. Write this down. °· Do not use tampons. Do not douche. °· Do not have sexual intercourse or orgasms until approved by your health care provider. °· If you pass any tissue from your vagina, save the tissue so you can show it to your health care provider. °· Only take over-the-counter or prescription medicines as directed by your health care provider. °· Do not take aspirin because it can make you  bleed. °· Keep all follow-up appointments as directed by your health care provider. °SEEK MEDICAL CARE IF: °· You have any vaginal bleeding during any part of your pregnancy. °· You have cramps or labor pains. °· You have a fever, not controlled by medicine. °SEEK IMMEDIATE MEDICAL CARE IF:  °· You have severe cramps in your back or belly (abdomen). °· You pass large clots or tissue from your vagina. °· Your bleeding increases. °· You feel light-headed or weak, or you have fainting episodes. °· You have chills. °· You are leaking fluid or have a gush of fluid from your vagina. °· You pass out while having a bowel movement. °MAKE SURE YOU: °· Understand these instructions. °· Will watch your condition. °· Will get help right away if you are not doing well or get worse. °Document Released: 09/17/2005 Document Revised: 12/13/2013 Document Reviewed: 08/15/2013 °ExitCare® Patient Information ©2015 [employer]. This information is not intended to replace advice given to you by your health care provider. Make sure you discuss any questions you have with your health care provider. °Bacterial Vaginosis °Bacterial vaginosis is a vaginal infection that occurs when the normal balance of bacteria in the vagina is disrupted. It results from an overgrowth of certain bacteria. This is the most common vaginal infection in women of childbearing age. Treatment is important to prevent complications, especially in pregnant women, as it can cause a premature delivery. °CAUSES  °Bacterial vaginosis is caused by an increase in harmful bacteria that are normally present in smaller amounts in the vagina. Several different kinds of bacteria can   cause bacterial vaginosis. However, the reason that the condition develops is not fully understood. °RISK FACTORS °Certain activities or behaviors can put you at an increased risk of developing bacterial vaginosis, including: °· Having a new sex partner or multiple sex partners. °· Douching. °· Using  an intrauterine device (IUD) for contraception. °Women do not get bacterial vaginosis from toilet seats, bedding, swimming pools, or contact with objects around them. °SIGNS AND SYMPTOMS  °Some women with bacterial vaginosis have no signs or symptoms. Common symptoms include: °· Grey vaginal discharge. °· A fishlike odor with discharge, especially after sexual intercourse. °· Itching or burning of the vagina and vulva. °· Burning or pain with urination. °DIAGNOSIS  °Your health care provider will take a medical history and examine the vagina for signs of bacterial vaginosis. A sample of vaginal fluid may be taken. Your health care provider will look at this sample under a microscope to check for bacteria and abnormal cells. A vaginal pH test may also be done.  °TREATMENT  °Bacterial vaginosis may be treated with antibiotic medicines. These may be given in the form of a pill or a vaginal cream. A second round of antibiotics may be prescribed if the condition comes back after treatment.  °HOME CARE INSTRUCTIONS  °· Only take over-the-counter or prescription medicines as directed by your health care provider. °· If antibiotic medicine was prescribed, take it as directed. Make sure you finish it even if you start to feel better. °· Do not have sex until treatment is completed. °· Tell all sexual partners that you have a vaginal infection. They should see their health care provider and be treated if they have problems, such as a mild rash or itching. °· Practice safe sex by using condoms and only having one sex partner. °SEEK MEDICAL CARE IF:  °· Your symptoms are not improving after 3 days of treatment. °· You have increased discharge or pain. °· You have a fever. °MAKE SURE YOU:  °· Understand these instructions. °· Will watch your condition. °· Will get help right away if you are not doing well or get worse. °FOR MORE INFORMATION  °Centers for Disease Control and Prevention, Division of STD Prevention:  www.cdc.gov/std °American Sexual Health Association (ASHA): www.ashastd.org  °Document Released: 12/08/2005 Document Revised: 09/28/2013 Document Reviewed: 07/20/2013 °ExitCare® Patient Information ©2015 [employer]. This information is not intended to replace advice given to you by your health care provider. Make sure you discuss any questions you have with your health care provider. ° °

## 2014-08-25 LAB — HIV ANTIBODY (ROUTINE TESTING W REFLEX): HIV 1&2 Ab, 4th Generation: NONREACTIVE

## 2014-08-25 LAB — GC/CHLAMYDIA PROBE AMP
CT Probe RNA: NEGATIVE
GC Probe RNA: NEGATIVE

## 2014-08-25 LAB — RPR

## 2014-10-23 ENCOUNTER — Encounter (HOSPITAL_BASED_OUTPATIENT_CLINIC_OR_DEPARTMENT_OTHER): Payer: Self-pay | Admitting: Emergency Medicine

## 2014-12-30 ENCOUNTER — Encounter (HOSPITAL_COMMUNITY): Payer: Self-pay | Admitting: *Deleted

## 2015-01-13 IMAGING — US US OB COMP LESS 14 WK
1 series · 14 of 21 positions shown · non-contrast
Comparison: None.

CLINICAL DATA: Pregnancy, vaginal bleeding.

EXAM:
OBSTETRIC <14 WK US AND TRANSVAGINAL OB US
TECHNIQUE: Both transabdominal and transvaginal ultrasound examinations were
performed for complete evaluation of the gestation as well as the
maternal uterus, adnexal regions, and pelvic cul-de-sac.
Transvaginal technique was performed to assess early pregnancy.

[Series 1: us ob comp less 14 wk · 0.11mm/px · 14 of 21 slices shown]
[im 1/21]
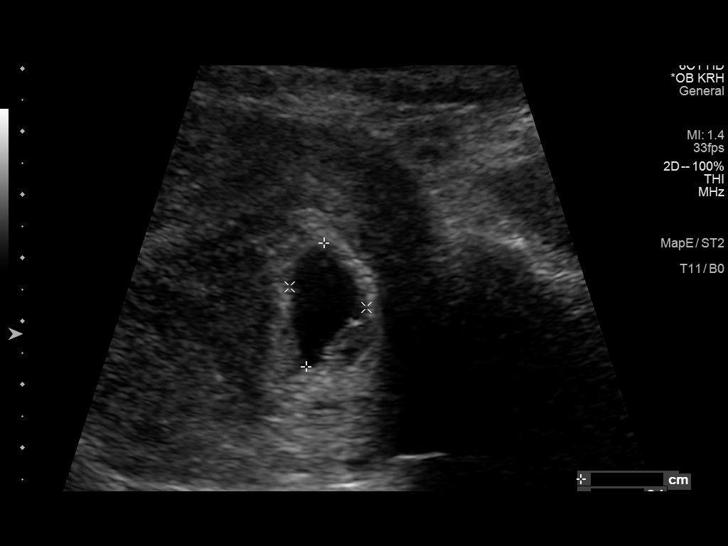
[im 3/21]
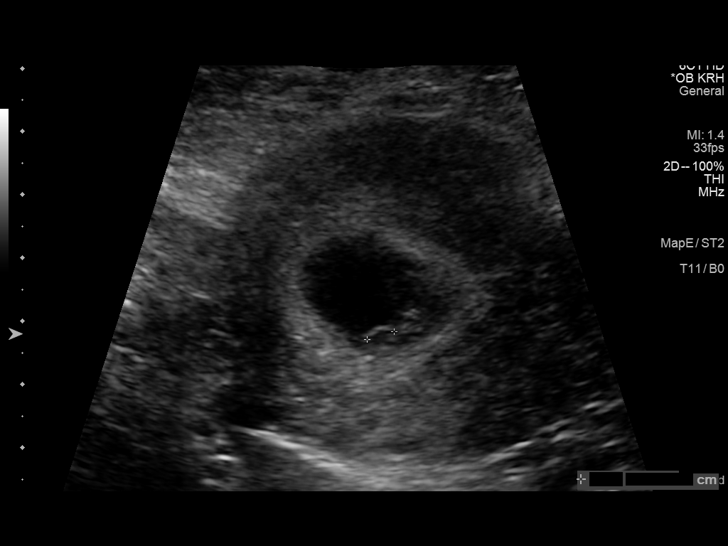
[im 4/21]
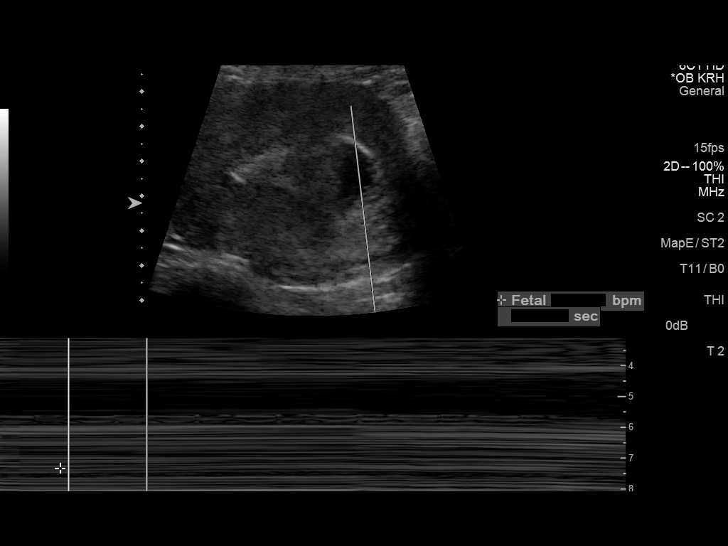
[im 6/21]
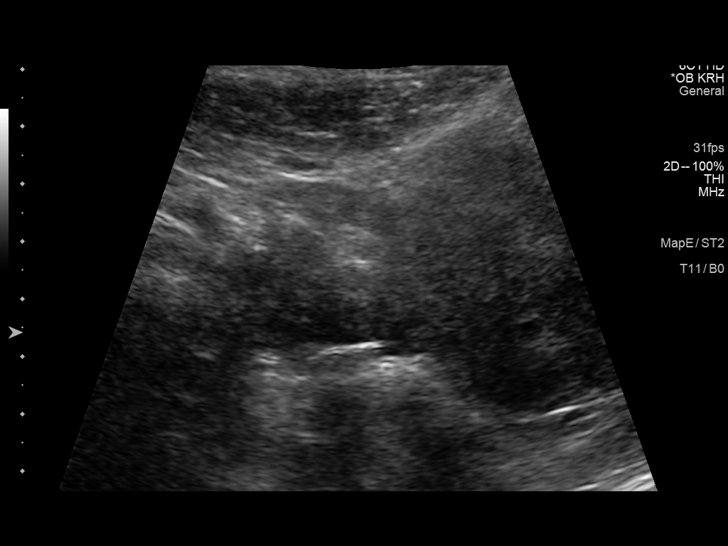
[im 7/21]
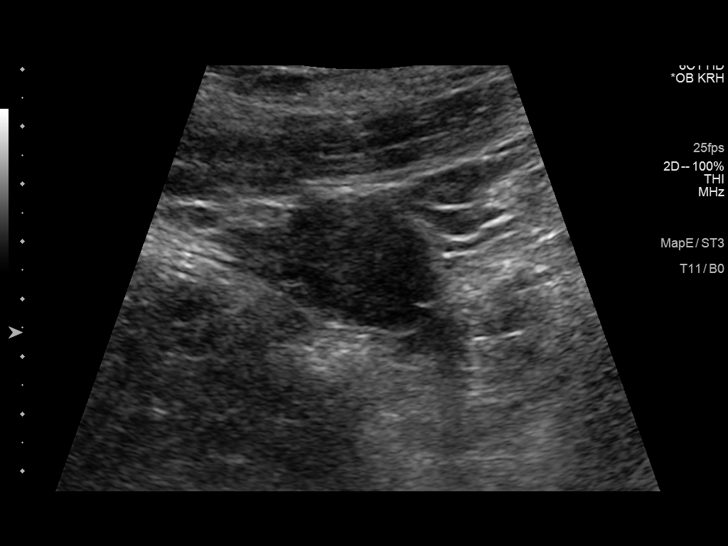
[im 9/21]
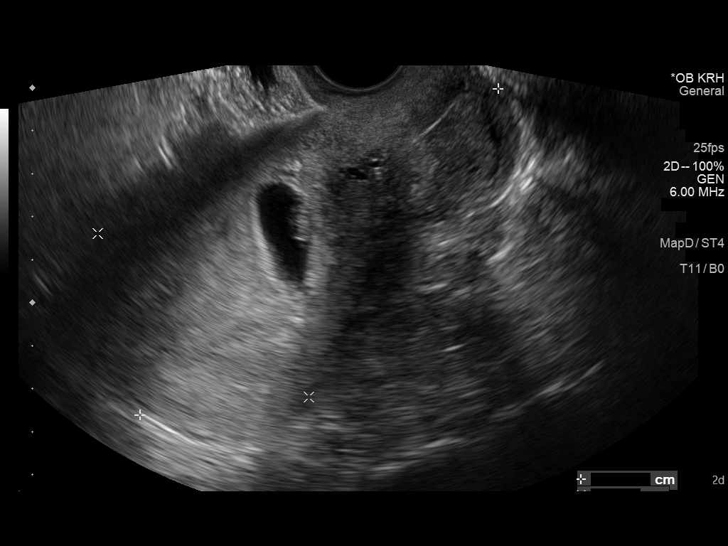
[im 10/21]
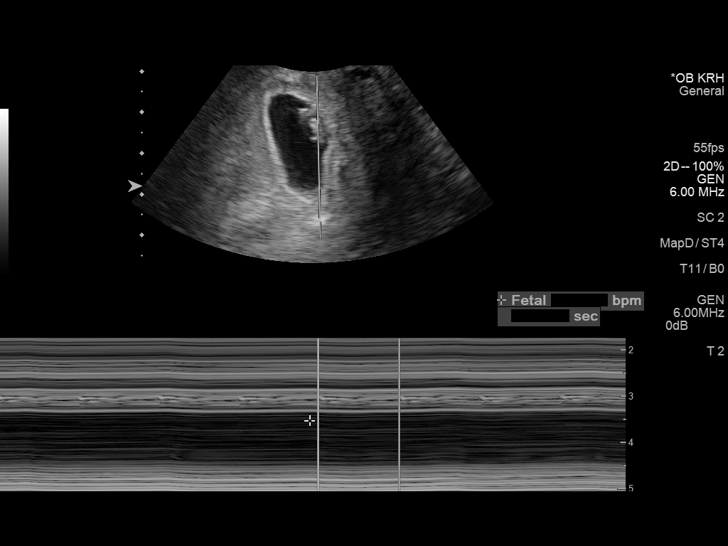
[im 12/21]
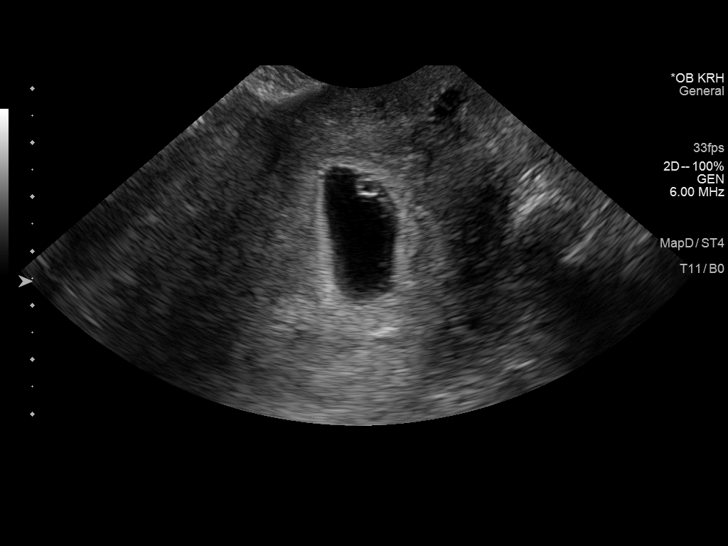
[im 13/21]
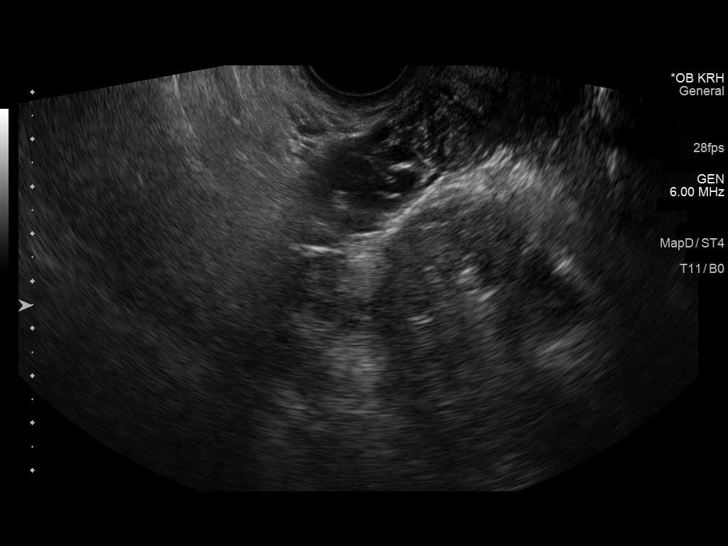
[im 15/21]
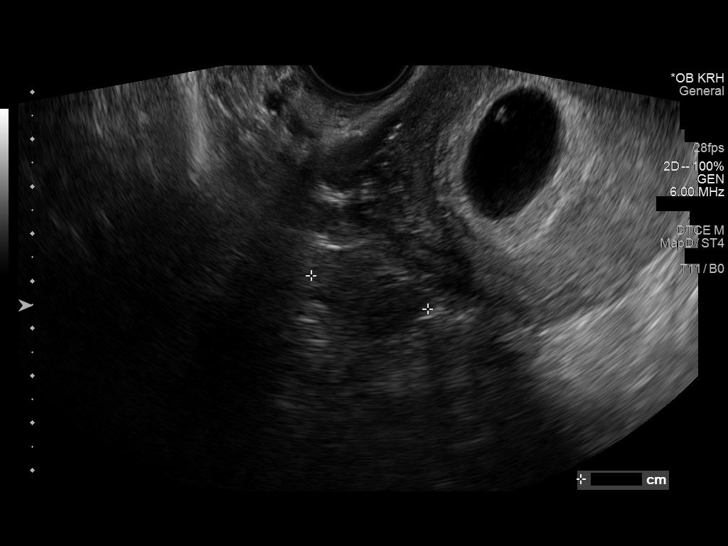
[im 16/21]
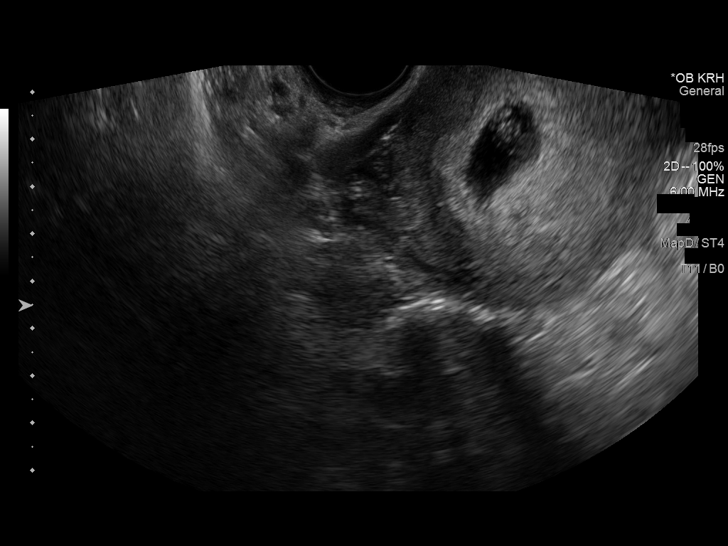
[im 18/21]
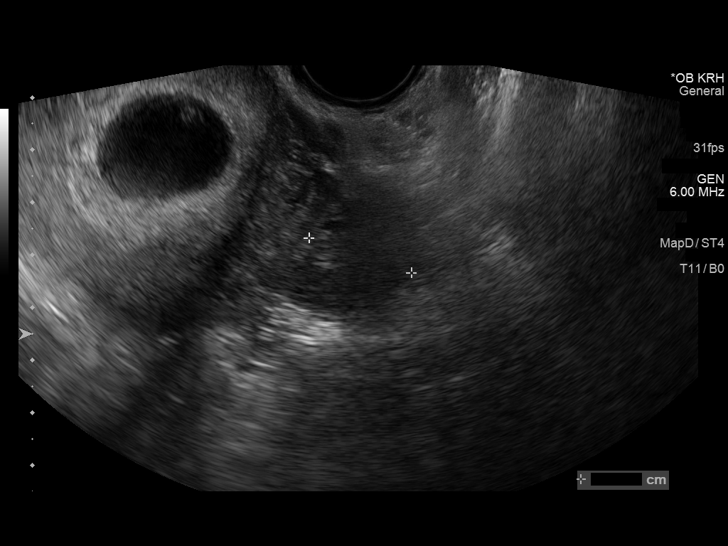
[im 19/21]
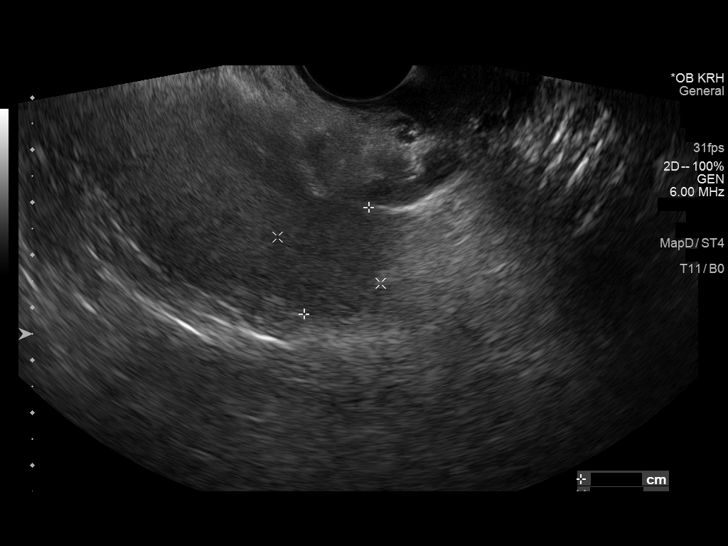
[im 21/21]
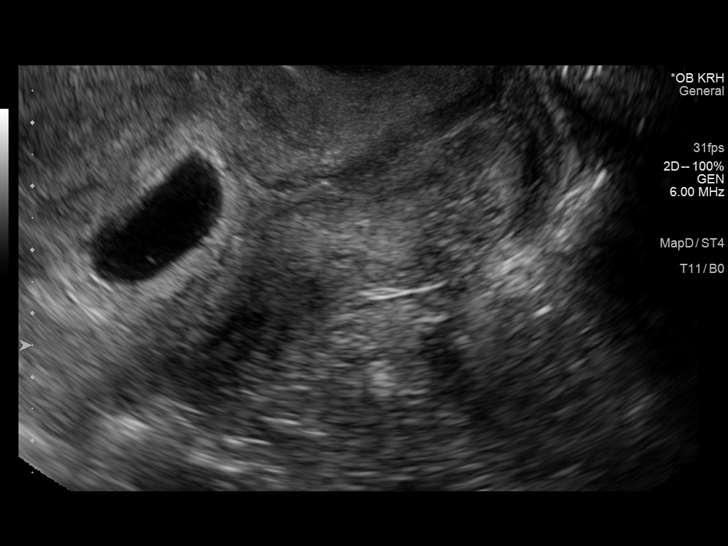

[14 of 21 positions shown; findings below may reference images not displayed]

FINDINGS: MSD:  2.1  mm   7 w   0  d

Maternal uterus/adnexae: Not well visualized due to overlying bowel.

Yolk sac:  Visualized.

Embryo:  Visualized.

Cardiac Activity: Visualized.

Heart Rate:  131 bpm
IMPRESSION: Single live intrauterine gestation of 7 weeks 0 days.

## 2016-10-16 ENCOUNTER — Encounter (HOSPITAL_BASED_OUTPATIENT_CLINIC_OR_DEPARTMENT_OTHER): Payer: Self-pay | Admitting: *Deleted

## 2016-10-16 ENCOUNTER — Emergency Department (HOSPITAL_BASED_OUTPATIENT_CLINIC_OR_DEPARTMENT_OTHER)
Admission: EM | Admit: 2016-10-16 | Discharge: 2016-10-16 | Disposition: A | Discharge status: Home / Self Care | Payer: BC Managed Care – PPO | Attending: Emergency Medicine | Admitting: Emergency Medicine

## 2016-10-16 DIAGNOSIS — L0201 Cutaneous abscess of face: Secondary | ICD-10-CM

## 2016-10-16 DIAGNOSIS — Z87891 Personal history of nicotine dependence: Secondary | ICD-10-CM | POA: Diagnosis not present

## 2016-10-16 DIAGNOSIS — F909 Attention-deficit hyperactivity disorder, unspecified type: Secondary | ICD-10-CM | POA: Insufficient documentation

## 2016-10-16 MED ORDER — OXYCODONE-ACETAMINOPHEN 5-325 MG PO TABS: 1.0000 | ORAL_TABLET | ORAL | 0 refills | Status: DC | PRN

## 2016-10-16 MED ORDER — SULFAMETHOXAZOLE-TRIMETHOPRIM 800-160 MG PO TABS: 1.0000 | ORAL_TABLET | Freq: Two times a day (BID) | ORAL | 0 refills | Status: AC

## 2016-10-16 MED ORDER — SULFAMETHOXAZOLE-TRIMETHOPRIM 800-160 MG PO TABS
1.0000 | ORAL_TABLET | Freq: Once | ORAL | Status: AC
Start: 2016-10-16 — End: 2016-10-16
  Administered 2016-10-16: 1 via ORAL
  Filled 2016-10-16: qty 1

## 2016-10-16 MED ORDER — LIDOCAINE-EPINEPHRINE 2 %-1:100000 IJ SOLN
5.0000 mL | Freq: Once | INTRAMUSCULAR | Status: AC
  Administered 2016-10-16: 5 mL
  Filled 2016-10-16: qty 1

## 2016-10-16 NOTE — Discharge Instructions (Signed)
I was not able to get the pus in the abscess to drain out with just a needle aspiration. I am referring you to an ear nose throat specialist to have the abscess drained and minimize any cosmetic effects. Please call the ENT office first thing in the morning and ask for same day appointment-that them know that you were referred from the emergency department.

## 2016-10-16 NOTE — ED Triage Notes (Signed)
Pt c/o pain to her right jaw that started this morning. States that she didn't notice anything on her face but felt a small "knot" states the area has increased in size throughout the day. Pt has redness and a firm circular area to her right jaw. Denies any fevers. C/o general aching. Denies any tooth pain.

## 2016-10-16 NOTE — ED Provider Notes (Signed)
[facility]   CSN: [id number] Arrival date & time: 10/16/16  0020     History   Chief Complaint Facial swelling   HPI Shelby Campos is a 35 y.o. female.  She noted a red area on her right cheek this morning which is gone progressively larger and has become painful. She rates pain at 8/10. She feels achy all over but has not had any fever, chills, sweats. She denies any trauma denies any toothache. She has not treated it with anything.   The history is provided by the patient.    Past Medical History:  Diagnosis Date  . 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 UTI (urinary tract infection)     Patient Active Problem List   Diagnosis Date Noted  . 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 PRE-ECLAMPSIA 10/02/2010  . NORMAL DELIVERY 10/02/2010  . IUGR 10/02/2010    Past Surgical History:  Procedure Laterality Date  . CESAREAN SECTION  2009   low transverse for NRFHTs  . TUBAL LIGATION      OB History    Gravida Para Term Preterm AB Living   4 3 1 2   3    SAB TAB Ectopic Multiple Live Births           3       Home Medications    Prior to Admission medications   Not on File    Family History Family History  Problem Relation Age of Onset  . ADD / ADHD Brother   . ADD / ADHD Mother   . Depression Mother     Social History Social History  Substance Use Topics  . Smoking status: Former Games developermoker  . Smokeless tobacco: Never Used  . Alcohol use Yes     Comment: monthly     Allergies   Review of patient's allergies indicates no known allergies.   Review of Systems Review of Systems  All other systems reviewed and are negative.    Physical Exam Updated Vital Signs BP 126/84   Pulse 78   Temp 98.1 F (36.7 C) (Oral)   Resp 18   Ht 5\' 2"  (1.575 m)   Wt 165 lb (74.8 kg)   LMP 09/29/2016   SpO2 99%   BMI 30.18 kg/m   Physical Exam  Nursing note and  vitals reviewed.  35 year old female, resting comfortably and in no acute distress. Vital signs are normal. Oxygen saturation is 99%, which is normal. Head is normocephalic and atraumatic. PERRLA, EOMI. Oropharynx is clear. There is a 3.5 cm x 3.5 cm indurated, tender area in the right malar area. There is surrounding erythema. No intraoral lesion is seen. No obvious dental caries or gingival inflammation. Neck is nontender and supple without adenopathy or JVD. Back is nontender and there is no CVA tenderness. Lungs are clear without rales, wheezes, or rhonchi. Chest is nontender. Heart has regular rate and rhythm without murmur. Abdomen is soft, flat, nontender without masses or hepatosplenomegaly and peristalsis is normoactive. Extremities have no cyanosis or edema, full range of motion is present. Skin is warm and dry without rash. Neurologic: Mental status is normal, cranial nerves are intact, there are no motor or sensory deficits.  ED Treatments / Results   Procedures Procedures (including critical care time) INCISION AND DRAINAGE Performed by: JYNWG,NFAOZGLICK,Bryten Maher Consent: Verbal consent obtained. Risks and benefits: risks, benefits and alternatives were discussed Type: abscess  Body area: Face  Anesthesia:  local infiltration  Local anesthetic: lidocaine 2% with epinephrine  Anesthetic total: 2 ml  Complexity: Simple  Aspiration was attempted with 18-gauge needle   Drainage: purulent, thick  Drainage amount: scant  Patient tolerance: Patient tolerated the procedure well with no immediate complications.     Medications Ordered in ED Medications  lidocaine-EPINEPHrine (XYLOCAINE W/EPI) 2 %-1:100000 (with pres) injection 5 mL (5 mLs Infiltration Given 10/16/16 0224)  sulfamethoxazole-trimethoprim (BACTRIM DS,SEPTRA DS) 800-160 MG per tablet 1 tablet (1 tablet Oral Given 10/16/16 0224)     Initial Impression / Assessment and Plan / ED Course  I have reviewed the triage  vital signs and the nursing notes.   Clinical Course   Facial abscess. Aspiration was attempted but unable to draw out pus as it is too thick to come through the needle. She is referred to ENT for definitive drainage. Patient advised that the reason she is being sent to ENT was because of cosmetic concerns. She is discharged with prescriptions for trimethoprim-sulfamethoxazole and oxycodone-acetaminophen.   Final Clinical Impressions(s) / ED Diagnoses   Final diagnoses:  Facial abscess    New Prescriptions New Prescriptions   OXYCODONE-ACETAMINOPHEN (PERCOCET) 5-325 MG TABLET    Take 1 tablet by mouth every 4 (four) hours as needed for moderate pain.   SULFAMETHOXAZOLE-TRIMETHOPRIM (BACTRIM DS,SEPTRA DS) 800-160 MG TABLET    Take 1 tablet by mouth 2 (two) times daily.     Dione Booze, MD 10/16/16 [phone]

## 2021-07-01 ENCOUNTER — Ambulatory Visit
Admission: EM | Admit: 2021-07-01 | Discharge: 2021-07-01 | Disposition: A | Discharge status: Home / Self Care | Payer: BC Managed Care – PPO | Attending: Emergency Medicine | Admitting: Emergency Medicine

## 2021-07-01 ENCOUNTER — Other Ambulatory Visit: Payer: Self-pay

## 2021-07-01 DIAGNOSIS — R059 Cough, unspecified: Secondary | ICD-10-CM | POA: Diagnosis not present

## 2021-07-01 DIAGNOSIS — R0981 Nasal congestion: Secondary | ICD-10-CM

## 2021-07-01 MED ORDER — AEROCHAMBER MV MISC: 2 refills | Status: AC

## 2021-07-01 MED ORDER — PROMETHAZINE-DM 6.25-15 MG/5ML PO SYRP: 5.0000 mL | ORAL_SOLUTION | Freq: Four times a day (QID) | ORAL | 0 refills | Status: AC | PRN

## 2021-07-01 MED ORDER — BENZONATATE 100 MG PO CAPS: 200.0000 mg | ORAL_CAPSULE | Freq: Three times a day (TID) | ORAL | 0 refills | Status: AC

## 2021-07-01 MED ORDER — ALBUTEROL SULFATE HFA 108 (90 BASE) MCG/ACT IN AERS: 2.0000 | INHALATION_SPRAY | RESPIRATORY_TRACT | 0 refills | Status: AC | PRN

## 2021-07-01 MED ORDER — IPRATROPIUM BROMIDE 0.06 % NA SOLN: 2.0000 | Freq: Four times a day (QID) | NASAL | 12 refills | Status: AC

## 2021-07-01 NOTE — ED Triage Notes (Signed)
Patient states that she has been having a cough x 1 month. States that she has a chronic cough for years but is having a flare.

## 2021-07-01 NOTE — ED Provider Notes (Signed)
[facility]    CSN: [id number] Arrival date & time: 07/01/21  0947      History   Chief Complaint Chief Complaint  Patient presents with   Cough    HPI Shelby Campos is a 40 y.o. female.   HPI  40 year old female here for evaluation of cough.  Patient reports that she has been experiencing a cough for the last month.  Her cough is nonproductive and is worse with heat and humidity.  She does not report an increase in her coughing symptoms when she lays flat.  She reports that she did have a cough 8 years ago when she was a smoker but that resolved when she quit smoking.  She does have some intermittent shortness of breath and wheezing but not at baseline.  She also denies any runny nose or postnasal drip.  No sore throat or other associated URI symptoms.  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 Authorizing Provider  albuterol (VENTOLIN HFA) 108 (90 Base) MCG/ACT inhaler Inhale 2 puffs into the lungs every 4 (four) hours as needed. 07/01/21  Yes Becky Augusta, NP  benzonatate (TESSALON) 100 MG capsule Take 2 capsules (200 mg total) by mouth every 8 (eight) hours. 07/01/21  Yes Becky Augusta, NP  ipratropium (ATROVENT) 0.06 % nasal spray Place 2 sprays into both  nostrils 4 (four) times daily. 07/01/21  Yes Becky Augusta, NP  promethazine-dextromethorphan (PROMETHAZINE-DM) 6.25-15 MG/5ML syrup Take 5 mLs by mouth 4 (four) times daily as needed. 07/01/21  Yes Becky Augusta, NP  Spacer/Aero-Holding Deretha Emory (AEROCHAMBER MV) inhaler Use as instructed 07/01/21  Yes Becky Augusta, NP    Family History Family History  Problem Relation Age of Onset   ADD / ADHD Brother    ADD / ADHD Mother    Depression Mother     Social History Social History   Tobacco Use   Smoking status: Former    Pack years: 0.00   Smokeless tobacco: Never  Substance Use Topics   Alcohol use: Yes    Comment: monthly   Drug use: No     Allergies   Patient has no known allergies.   Review of Systems Review of Systems  Constitutional:  Negative for activity change, appetite change and fever.  HENT:  Negative for congestion, postnasal drip, rhinorrhea and sore throat.   Respiratory:  Positive for cough, shortness of breath and wheezing.   Hematological: Negative.   Psychiatric/Behavioral: Negative.  Physical Exam Triage Vital Signs ED Triage Vitals  Enc Vitals Group     BP 07/01/21 0956 113/87     Pulse Rate 07/01/21 0956 78     Resp 07/01/21 0956 18     Temp 07/01/21 0956 98.2 F (36.8 C)     Temp Source 07/01/21 0956 Oral     SpO2 07/01/21 0956 98 %     Weight 07/01/21 0954 175 lb (79.4 kg)     Height 07/01/21 0954 5\' 2"  (1.575 m)     Head Circumference --      Peak Flow --      Pain Score 07/01/21 0954 0     Pain Loc --      Pain Edu? --      Excl. in GC? --    No data found.  Updated Vital Signs BP 113/87 (BP Location: Right Arm)   Pulse 78   Temp 98.2 F (36.8 C) (Oral)   Resp 18   Ht 5\' 2"  (1.575 m)   Wt 175 lb (79.4 kg)   LMP 07/01/2021   SpO2 98%   Breastfeeding No   BMI 32.01 kg/m   Visual Acuity Right Eye Distance:   Left Eye Distance:   Bilateral Distance:    Right Eye Near:   Left Eye Near:    Bilateral Near:      Physical Exam Vitals and nursing note reviewed.  Constitutional:      General: She is not in acute distress.    Appearance: Normal appearance. She is not ill-appearing.  HENT:     Head: Normocephalic and atraumatic.     Right Ear: Tympanic membrane, ear canal and external ear normal. There is no impacted cerumen.     Left Ear: Tympanic membrane, ear canal and external ear normal. There is no impacted cerumen.     Nose: Congestion and rhinorrhea present.     Mouth/Throat:     Mouth: Mucous membranes are moist.     Pharynx: Oropharynx is clear. No posterior oropharyngeal erythema.  Cardiovascular:     Rate and Rhythm: Normal rate and regular rhythm.     Pulses: Normal pulses.     Heart sounds: Normal heart sounds. No murmur heard.   No gallop.  Pulmonary:     Effort: Pulmonary effort is normal.     Breath sounds: Normal breath sounds. No wheezing, rhonchi or rales.  Musculoskeletal:     Cervical back: Normal range of motion and neck supple.  Lymphadenopathy:     Cervical: No cervical adenopathy.  Skin:    General: Skin is warm and dry.     Capillary Refill: Capillary refill takes less than 2 seconds.     Findings: No erythema or rash.  Neurological:     General: No focal deficit present.     Mental Status: She is alert and oriented to person, place, and time.  Psychiatric:        Mood and Affect: Mood normal.        Behavior: Behavior normal.        Thought Content: Thought content normal.        Judgment: Judgment normal.     UC Treatments / Results  Labs (all labs ordered are listed, but only abnormal results are displayed) Labs Reviewed - No data to display  EKG   Radiology No results found.  Procedures Procedures (including critical care time)  Medications Ordered in UC Medications - No data to display  Initial  Impression / Assessment and Plan / UC Course  I have reviewed the triage vital signs and the nursing notes.  Pertinent labs & imaging results  that were available during my care of the patient were reviewed by me and considered in my medical decision making (see chart for details).  Patient is a very pleasant and nontoxic-appearing 40 year old female here for evaluation of cough x1 month as outlined in HPI above.  Patient's physical exam reveals pearly gray tympanic membranes bilaterally with a normal light reflex and clear external auditory canals.  Nasal mucosa is mildly edematous and pale with scant clear nasal discharge.  Oropharyngeal exam is benign save for some mild clear postnasal drip.  No cervical lymphadenopathy appreciated exam.  Cardiopulmonary exam is benign.  Patient's cough may very well be driven by postnasal drip or may be mild early COPD.  We will treat patient conservatively as patient has a follow-up appointment with your PCP later in the week.  We will treat with Atrovent nasal spray to help with nasal congestion postnasal drip, Tessalon Perles and Promethazine DM cough syrup for cough, and albuterol inhaler with spacer for intermittent shortness of breath and wheezing.  To follow-up with her PCP and inquire about pulmonary function testing given her smoking history.   Final Clinical Impressions(s) / UC Diagnoses   Final diagnoses:  Cough  Nasal congestion     Discharge Instructions      Use the Atrovent nasal spray, 2 squirts in each nostril every 6 hours, as needed for runny nose and postnasal drip.  Use the Tessalon Perles every 8 hours during the day.  Take them with a small sip of water.  They may give you some numbness to the base of your tongue or a metallic taste in your mouth, this is normal.  Use the Promethazine DM cough syrup at bedtime for cough and congestion.  It will make you drowsy so do not take it during the day.  Use the Albuterol inhaler with spacer, 2 puffs every 4-6 hours, to help with shortness of breath and cough.  Return for reevaluation or see your primary care provider for any new or  worsening symptoms.      ED Prescriptions     Medication Sig Dispense Auth. Provider   benzonatate (TESSALON) 100 MG capsule Take 2 capsules (200 mg total) by mouth every 8 (eight) hours. 21 capsule Becky Augusta, NP   ipratropium (ATROVENT) 0.06 % nasal spray Place 2 sprays into both nostrils 4 (four) times daily. 15 mL Becky Augusta, NP   promethazine-dextromethorphan (PROMETHAZINE-DM) 6.25-15 MG/5ML syrup Take 5 mLs by mouth 4 (four) times daily as needed. 118 mL Becky Augusta, NP   albuterol (VENTOLIN HFA) 108 (90 Base) MCG/ACT inhaler Inhale 2 puffs into the lungs every 4 (four) hours as needed. 18 g Becky Augusta, NP   Spacer/Aero-Holding Chambers (AEROCHAMBER MV) inhaler Use as instructed 1 each Becky Augusta, NP      PDMP not reviewed this encounter.   Becky Augusta, NP 07/01/21 1025

## 2021-07-01 NOTE — Discharge Instructions (Addendum)
Use the Atrovent nasal spray, 2 squirts in each nostril every 6 hours, as needed for runny nose and postnasal drip.  Use the Tessalon Perles every 8 hours during the day.  Take them with a small sip of water.  They may give you some numbness to the base of your tongue or a metallic taste in your mouth, this is normal.  Use the Promethazine DM cough syrup at bedtime for cough and congestion.  It will make you drowsy so do not take it during the day.  Use the Albuterol inhaler with spacer, 2 puffs every 4-6 hours, to help with shortness of breath and cough.  Return for reevaluation or see your primary care provider for any new or worsening symptoms.
